# Patient Record
Sex: Female | Born: 1980 | Race: White | Hispanic: No | Marital: Married | Smoking: Never smoker
Health system: Southern US, Community
[De-identification: ages and names within clinical notes are randomized; demographics above are authoritative.]

## PROBLEM LIST (undated history)

## (undated) ENCOUNTER — Inpatient Hospital Stay (HOSPITAL_COMMUNITY): Payer: Self-pay

## (undated) DIAGNOSIS — Z789 Other specified health status: Secondary | ICD-10-CM

## (undated) DIAGNOSIS — F419 Anxiety disorder, unspecified: Secondary | ICD-10-CM

## (undated) DIAGNOSIS — F32A Depression, unspecified: Secondary | ICD-10-CM

## (undated) DIAGNOSIS — A0472 Enterocolitis due to Clostridium difficile, not specified as recurrent: Secondary | ICD-10-CM

## (undated) DIAGNOSIS — N841 Polyp of cervix uteri: Secondary | ICD-10-CM

## (undated) DIAGNOSIS — R55 Syncope and collapse: Secondary | ICD-10-CM

## (undated) HISTORY — DX: Enterocolitis due to Clostridium difficile, not specified as recurrent: A04.72

## (undated) HISTORY — DX: Syncope and collapse: R55

## (undated) HISTORY — PX: WISDOM TOOTH EXTRACTION: SHX21

## (undated) HISTORY — PX: COLONOSCOPY: SHX174

---

## 2009-12-13 DIAGNOSIS — A0472 Enterocolitis due to Clostridium difficile, not specified as recurrent: Secondary | ICD-10-CM

## 2009-12-13 HISTORY — DX: Enterocolitis due to Clostridium difficile, not specified as recurrent: A04.72

## 2015-12-14 NOTE — L&D Delivery Note (Signed)
    Laural BenesJohnson, Girl Misty StanleyStacey [[id number]][[id number]]  Delivery Note At 1:42 PM a viable female was delivered via Vaginal, Spontaneous Delivery (Presentation: OA; Vertex  ).  APGAR: 9, 9; weight 7 lb 13.2 oz (3550 g).   Placenta status: delivered intact with gentle traction.  Cord: 3 vessle with the following complications: none .    Anesthesia:  epidural Episiotomy: None Lacerations: 1st degree;Perineal Suture Repair: 3.0 vicryl Est. Blood Loss (mL): 125  Mom to postpartum.  Baby to Couplet care / Skin to Skin.  Ernestina Pennaicholas Wilna Pennie 10/18/2016, 4:27 PM

## 2016-03-12 ENCOUNTER — Ambulatory Visit (INDEPENDENT_AMBULATORY_CARE_PROVIDER_SITE_OTHER): Payer: BLUE CROSS/BLUE SHIELD | Admitting: Family

## 2016-03-12 ENCOUNTER — Encounter: Payer: Self-pay | Admitting: Family

## 2016-03-12 VITALS — BP 133/78 | HR 78 | Ht 67.0 in | Wt 158.0 lb

## 2016-03-12 DIAGNOSIS — O0990 Supervision of high risk pregnancy, unspecified, unspecified trimester: Secondary | ICD-10-CM | POA: Insufficient documentation

## 2016-03-12 DIAGNOSIS — Z6791 Unspecified blood type, Rh negative: Secondary | ICD-10-CM

## 2016-03-12 DIAGNOSIS — O26899 Other specified pregnancy related conditions, unspecified trimester: Secondary | ICD-10-CM | POA: Insufficient documentation

## 2016-03-12 DIAGNOSIS — O09529 Supervision of elderly multigravida, unspecified trimester: Secondary | ICD-10-CM | POA: Insufficient documentation

## 2016-03-12 DIAGNOSIS — Z36 Encounter for antenatal screening of mother: Secondary | ICD-10-CM

## 2016-03-12 DIAGNOSIS — Z3491 Encounter for supervision of normal pregnancy, unspecified, first trimester: Secondary | ICD-10-CM

## 2016-03-12 DIAGNOSIS — Z3480 Encounter for supervision of other normal pregnancy, unspecified trimester: Secondary | ICD-10-CM

## 2016-03-12 DIAGNOSIS — O09521 Supervision of elderly multigravida, first trimester: Secondary | ICD-10-CM

## 2016-03-12 DIAGNOSIS — Z1151 Encounter for screening for human papillomavirus (HPV): Secondary | ICD-10-CM

## 2016-03-12 DIAGNOSIS — Z3481 Encounter for supervision of other normal pregnancy, first trimester: Secondary | ICD-10-CM

## 2016-03-12 DIAGNOSIS — Z124 Encounter for screening for malignant neoplasm of cervix: Secondary | ICD-10-CM | POA: Diagnosis not present

## 2016-03-12 DIAGNOSIS — O360110 Maternal care for anti-D [Rh] antibodies, first trimester, not applicable or unspecified: Secondary | ICD-10-CM

## 2016-03-12 NOTE — Patient Instructions (Signed)
First Trimester of Pregnancy The first trimester of pregnancy is from week 1 until the end of week 12 (months 1 through 3). A week after a sperm fertilizes an egg, the egg will implant on the wall of the uterus. This embryo will begin to develop into a baby. Genes from you and your partner are forming the baby. The female genes determine whether the baby is a boy or a girl. At 6-8 weeks, the eyes and face are formed, and the heartbeat can be seen on ultrasound. At the end of 12 weeks, all the baby's organs are formed.  Now that you are pregnant, you will want to do everything you can to have a healthy baby. Two of the most important things are to get good prenatal care and to follow your health care provider's instructions. Prenatal care is all the medical care you receive before the baby's birth. This care will help prevent, find, and treat any problems during the pregnancy and childbirth. BODY CHANGES Your body goes through many changes during pregnancy. The changes vary from woman to woman.   You may gain or lose a couple of pounds at first.  You may feel sick to your stomach (nauseous) and throw up (vomit). If the vomiting is uncontrollable, call your health care provider.  You may tire easily.  You may develop headaches that can be relieved by medicines approved by your health care provider.  You may urinate more often. Painful urination may mean you have a bladder infection.  You may develop heartburn as a result of your pregnancy.  You may develop constipation because certain hormones are causing the muscles that push waste through your intestines to slow down.  You may develop hemorrhoids or swollen, bulging veins (varicose veins).  Your breasts may begin to grow larger and become tender. Your nipples may stick out more, and the tissue that surrounds them (areola) may become darker.  Your gums may bleed and may be sensitive to brushing and flossing.  Dark spots or blotches (chloasma,  mask of pregnancy) may develop on your face. This will likely fade after the baby is born.  Your menstrual periods will stop.  You may have a loss of appetite.  You may develop cravings for certain kinds of food.  You may have changes in your emotions from day to day, such as being excited to be pregnant or being concerned that something may go wrong with the pregnancy and baby.  You may have more vivid and strange dreams.  You may have changes in your hair. These can include thickening of your hair, rapid growth, and changes in texture. Some women also have hair loss during or after pregnancy, or hair that feels dry or thin. Your hair will most likely return to normal after your baby is born. WHAT TO EXPECT AT YOUR PRENATAL VISITS During a routine prenatal visit:  You will be weighed to make sure you and the baby are growing normally.  Your blood pressure will be taken.  Your abdomen will be measured to track your baby's growth.  The fetal heartbeat will be listened to starting around week 10 or 12 of your pregnancy.  Test results from any previous visits will be discussed. Your health care provider may ask you:  How you are feeling.  If you are feeling the baby move.  If you have had any abnormal symptoms, such as leaking fluid, bleeding, severe headaches, or abdominal cramping.  If you are using any tobacco products,   including cigarettes, chewing tobacco, and electronic cigarettes.  If you have any questions. Other tests that may be performed during your first trimester include:  Blood tests to find your blood type and to check for the presence of any previous infections. They will also be used to check for low iron levels (anemia) and Rh antibodies. Later in the pregnancy, blood tests for diabetes will be done along with other tests if problems develop.  Urine tests to check for infections, diabetes, or protein in the urine.  An ultrasound to confirm the proper growth  and development of the baby.  An amniocentesis to check for possible genetic problems.  Fetal screens for spina bifida and Down syndrome.  You may need other tests to make sure you and the baby are doing well.  HIV (human immunodeficiency virus) testing. Routine prenatal testing includes screening for HIV, unless you choose not to have this test. HOME CARE INSTRUCTIONS  Medicines  Follow your health care provider's instructions regarding medicine use. Specific medicines may be either safe or unsafe to take during pregnancy.  Take your prenatal vitamins as directed.  If you develop constipation, try taking a stool softener if your health care provider approves. Diet  Eat regular, well-balanced meals. Choose a variety of foods, such as meat or vegetable-based protein, fish, milk and low-fat dairy products, vegetables, fruits, and whole grain breads and cereals. Your health care provider will help you determine the amount of weight gain that is right for you.  Avoid raw meat and uncooked cheese. These carry germs that can cause birth defects in the baby.  Eating four or five small meals rather than three large meals a day may help relieve nausea and vomiting. If you start to feel nauseous, eating a few soda crackers can be helpful. Drinking liquids between meals instead of during meals also seems to help nausea and vomiting.  If you develop constipation, eat more high-fiber foods, such as fresh vegetables or fruit and whole grains. Drink enough fluids to keep your urine clear or pale yellow. Activity and Exercise  Exercise only as directed by your health care provider. Exercising will help you:  Control your weight.  Stay in shape.  Be prepared for labor and delivery.  Experiencing pain or cramping in the lower abdomen or low back is a good sign that you should stop exercising. Check with your health care provider before continuing normal exercises.  Try to avoid standing for long  periods of time. Move your legs often if you must stand in one place for a long time.  Avoid heavy lifting.  Wear low-heeled shoes, and practice good posture.  You may continue to have sex unless your health care provider directs you otherwise. Relief of Pain or Discomfort  Wear a good support bra for breast tenderness.   Take warm sitz baths to soothe any pain or discomfort caused by hemorrhoids. Use hemorrhoid cream if your health care provider approves.   Rest with your legs elevated if you have leg cramps or low back pain.  If you develop varicose veins in your legs, wear support hose. Elevate your feet for 15 minutes, 3-4 times a day. Limit salt in your diet. Prenatal Care  Schedule your prenatal visits by the twelfth week of pregnancy. They are usually scheduled monthly at first, then more often in the last 2 months before delivery.  Write down your questions. Take them to your prenatal visits.  Keep all your prenatal visits as directed by your   health care provider. Safety  Wear your seat belt at all times when driving.  Make a list of emergency phone numbers, including numbers for family, friends, the hospital, and police and fire departments. General Tips  Ask your health care provider for a referral to a local prenatal education class. Begin classes no later than at the beginning of month 6 of your pregnancy.  Ask for help if you have counseling or nutritional needs during pregnancy. Your health care provider can offer advice or refer you to specialists for help with various needs.  Do not use hot tubs, steam rooms, or saunas.  Do not douche or use tampons or scented sanitary pads.  Do not cross your legs for long periods of time.  Avoid cat litter boxes and soil used by cats. These carry germs that can cause birth defects in the baby and possibly loss of the fetus by miscarriage or stillbirth.  Avoid all smoking, herbs, alcohol, and medicines not prescribed by  your health care provider. Chemicals in these affect the formation and growth of the baby.  Do not use any tobacco products, including cigarettes, chewing tobacco, and electronic cigarettes. If you need help quitting, ask your health care provider. You may receive counseling support and other resources to help you quit.  Schedule a dentist appointment. At home, brush your teeth with a soft toothbrush and be gentle when you floss. SEEK MEDICAL CARE IF:   You have dizziness.  You have mild pelvic cramps, pelvic pressure, or nagging pain in the abdominal area.  You have persistent nausea, vomiting, or diarrhea.  You have a bad smelling vaginal discharge.  You have pain with urination.  You notice increased swelling in your face, hands, legs, or ankles. SEEK IMMEDIATE MEDICAL CARE IF:   You have a fever.  You are leaking fluid from your vagina.  You have spotting or bleeding from your vagina.  You have severe abdominal cramping or pain.  You have rapid weight gain or loss.  You vomit blood or material that looks like coffee grounds.  You are exposed to German measles and have never had them.  You are exposed to fifth disease or chickenpox.  You develop a severe headache.  You have shortness of breath.  You have any kind of trauma, such as from a fall or a car accident.   This information is not intended to replace advice given to you by your health care provider. Make sure you discuss any questions you have with your health care provider.   Document Released: 11/23/2001 Document Revised: 12/20/2014 Document Reviewed: 10/09/2013 Elsevier Interactive Patient Education 2016 [employer] Inc[employer]  

## 2016-03-12 NOTE — Progress Notes (Signed)
   Subjective:    Jordan PandaStacey K Barrack is a [id number] 3120w0d being seen today for her first obstetrical visit.  Her obstetrical history is significant for +GDM screening in past pregnancy, but normal labs throughout. Also reports history of sciatic back pain.  Currently utilizing chiropractor for 5 more visits.  Pain has improved with treatment and exercises.   Patient does intend to breast feed. Pregnancy history fully reviewed.  Patient reports nausea, no bleeding and no cramping.  Filed Vitals:   03/12/16 0908 03/12/16 0911  BP: 133/78   Pulse: 78   Height:  5\' 7"  (1.702 m)  Weight: 158 lb (71.668 kg)     HISTORY: OB History  Gravida Para Term Preterm AB SAB TAB Ectopic Multiple Living  3 2 2       2     # Outcome Date GA Lbr Len/2nd Weight Sex Delivery Anes PTL Lv  3 Current           2 Term      Vag-Spont   Y  1 Term      Vag-Spont   Y     Past Medical History  Diagnosis Date  . C. difficile enteritis 2011  . Vasovagal syncope    No past surgical history on file. Family History  Problem Relation Age of Onset  . Diabetes Father   . Hypertension Paternal Grandmother   . Skin cancer Mother   . Breast cancer Maternal Grandmother 50     Exam   BP 133/78 mmHg  Pulse 78  Ht 5\' 7"  (1.702 m)  Wt 158 lb (71.668 kg)  BMI 24.74 kg/m2  LMP 01/16/2016 Uterine Size: size equals dates  Pelvic Exam:    Perineum: No Hemorrhoids, Normal Perineum   Vulva: normal   Vagina:  normal mucosa, normal discharge, no palpable nodules   pH: Not done   Cervix: no bleeding following Pap, no cervical motion tenderness and no lesions   Adnexa: normal adnexa and no mass, fullness, tenderness   Bony Pelvis: Adequate  System: Breast:  No nipple retraction or dimpling, No nipple discharge or bleeding, No axillary or supraclavicular adenopathy, Normal to palpation without dominant masses   Skin: normal coloration and turgor, no rashes    Neurologic: negative   Extremities: normal strength, tone,  and muscle mass   HEENT neck supple with midline trachea and thyroid without masses   Mouth/Teeth mucous membranes moist, pharynx normal without lesions   Neck supple and no masses   Cardiovascular: regular rate and rhythm, no murmurs or gallops   Respiratory:  appears well, vitals normal, no respiratory distress, acyanotic, normal RR, neck free of mass or lymphadenopathy, chest clear, no wheezing, crepitations, rhonchi, normal symmetric air entry   Abdomen: soft, non-tender; bowel sounds normal; no masses,  no organomegaly   Urinary: urethral meatus normal      Assessment:    Pregnancy: [id number] Patient Active Problem List   Diagnosis Date Noted  . Supervision of normal pregnancy, antepartum 03/12/2016  . Antepartum multigravida of advanced maternal age 36/31/2017  . Rh negative, antepartum 03/12/2016        Plan:     Initial labs drawn. Pap smear collected Prenatal vitamins. Problem list reviewed and updated. Genetic Screening discussed First Screen and Integrated Screen: desires Panorama to include gender.  Follow up in 4 weeks.  Marlis EdelsonKARIM, WALIDAH N 03/12/2016

## 2016-03-12 NOTE — Progress Notes (Signed)
Bedside US shows single IUP with FHR 150 and CRL 7567w4d

## 2016-03-15 LAB — PRENATAL PROFILE (SOLSTAS)
ANTIBODY SCREEN: NEGATIVE
BASOS PCT: 0 % (ref 0–1)
Basophils Absolute: 0 10*3/uL (ref 0.0–0.1)
EOS ABS: 0.1 10*3/uL (ref 0.0–0.7)
Eosinophils Relative: 1 % (ref 0–5)
HEMATOCRIT: 39.4 % (ref 36.0–46.0)
HIV: NONREACTIVE
Hemoglobin: 13.2 g/dL (ref 12.0–15.0)
Hepatitis B Surface Ag: NEGATIVE
LYMPHS PCT: 18 % (ref 12–46)
Lymphs Abs: 1.6 10*3/uL (ref 0.7–4.0)
MCH: 30.6 pg (ref 26.0–34.0)
MCHC: 33.5 g/dL (ref 30.0–36.0)
MCV: 91.4 fL (ref 78.0–100.0)
MONO ABS: 0.6 10*3/uL (ref 0.1–1.0)
MONOS PCT: 7 % (ref 3–12)
MPV: 9.4 fL (ref 8.6–12.4)
NEUTROS ABS: 6.7 10*3/uL (ref 1.7–7.7)
Neutrophils Relative %: 74 % (ref 43–77)
Platelets: 218 10*3/uL (ref 150–400)
RBC: 4.31 MIL/uL (ref 3.87–5.11)
RDW: 12.8 % (ref 11.5–15.5)
RH TYPE: NEGATIVE
Rubella: 4.12 Index — ABNORMAL HIGH (ref <0.90)
WBC: 9.1 10*3/uL (ref 4.0–10.5)

## 2016-03-15 LAB — CYTOLOGY - PAP

## 2016-03-16 LAB — HEMOGLOBINOPATHY EVALUATION
HEMOGLOBIN OTHER: 0 %
HGB A: 97.3 % (ref 96.8–97.8)
HGB S QUANTITAION: 0 %
Hgb A2 Quant: 2.4 % (ref 2.2–3.2)
Hgb F Quant: 0.3 % (ref 0.0–2.0)

## 2016-03-16 LAB — GC/CHLAMYDIA PROBE AMP
CT PROBE, AMP APTIMA: NOT DETECTED
GC PROBE AMP APTIMA: NOT DETECTED

## 2016-03-17 LAB — CYSTIC FIBROSIS DIAGNOSTIC STUDY

## 2016-03-19 LAB — CULTURE, OB URINE: Colony Count: >=100000

## 2016-03-22 ENCOUNTER — Other Ambulatory Visit: Payer: Self-pay | Admitting: Family

## 2016-03-22 DIAGNOSIS — O2341 Unspecified infection of urinary tract in pregnancy, first trimester: Secondary | ICD-10-CM

## 2016-03-22 DIAGNOSIS — O2340 Unspecified infection of urinary tract in pregnancy, unspecified trimester: Secondary | ICD-10-CM | POA: Insufficient documentation

## 2016-03-22 MED ORDER — AMOXICILLIN 500 MG PO CAPS: 500.0000 mg | ORAL_CAPSULE | Freq: Three times a day (TID) | ORAL | Status: DC

## 2016-03-23 ENCOUNTER — Telehealth: Payer: Self-pay | Admitting: *Deleted

## 2016-03-23 NOTE — Telephone Encounter (Signed)
Pt notified of urine culture results.  Brand Jordan Larson, CNM placed the antibiotic orders but [facility]

## 2016-03-23 NOTE — Telephone Encounter (Signed)
-----   Message from Marlis EdelsonWalidah N Karim, [place] sent at 03/23/2016  8:15 AM EDT ----- Regarding: UTI - Please call Please call patient and notify her regarding UTI and need for antibiotics.  Antibiotics will need to be called in .  Order is in, it wouldn't let me electronically send.  Thank you!!  I tried to call but rolled over to voicemail.

## 2016-04-07 ENCOUNTER — Ambulatory Visit (INDEPENDENT_AMBULATORY_CARE_PROVIDER_SITE_OTHER): Payer: BLUE CROSS/BLUE SHIELD | Admitting: Obstetrics & Gynecology

## 2016-04-07 VITALS — BP 107/69 | HR 67 | Wt 157.0 lb

## 2016-04-07 DIAGNOSIS — Z36 Encounter for antenatal screening of mother: Secondary | ICD-10-CM | POA: Diagnosis not present

## 2016-04-07 DIAGNOSIS — Z3481 Encounter for supervision of other normal pregnancy, first trimester: Secondary | ICD-10-CM

## 2016-04-07 DIAGNOSIS — Z3689 Encounter for other specified antenatal screening: Secondary | ICD-10-CM

## 2016-04-07 DIAGNOSIS — O09521 Supervision of elderly multigravida, first trimester: Secondary | ICD-10-CM

## 2016-04-07 NOTE — Progress Notes (Signed)
Subjective:  Jordan Larson is a 35 y.o. G3P2002 at 2850w5d being seen today for ongoing prenatal care.  She is currently monitored for the following issues for this low-risk pregnancy and has Supervision of normal pregnancy, antepartum; Antepartum multigravida of advanced maternal age; Rh negative, antepartum; and UTI in pregnancy, antepartum on her problem list.  Patient reports no complaints.  Contractions: Not present. Vag. Bleeding: None.  Movement: Present. Denies leaking of fluid.   The following portions of the patient's history were reviewed and updated as appropriate: allergies, current medications, past family history, past medical history, past social history, past surgical history and problem list. Problem list updated.  Objective:   Filed Vitals:   04/07/16 1006  BP: 107/69  Pulse: 67  Weight: 157 lb (71.215 kg)    Fetal Status: Fetal Heart Rate (bpm): 159   Movement: Present     General:  Alert, oriented and cooperative. Patient is in no acute distress.  Skin: Skin is warm and dry. No rash noted.   Cardiovascular: Normal heart rate noted  Respiratory: Normal respiratory effort, no problems with respiration noted  Abdomen: Soft, gravid, appropriate for gestational age. Pain/Pressure: Absent     Pelvic: Vag. Bleeding: None Vag D/C Character: Thin  Cervical exam deferred        Extremities: Normal range of motion.  Edema: None  Mental Status: Normal mood and affect. Normal behavior. Normal judgment and thought content.   Urinalysis: Urine Protein: Negative Urine Glucose: Negative  Assessment and Plan:  Pregnancy: G3P2002 at 2750w5d  1. Antepartum multigravida of advanced maternal age, first trimester Panorama done today.   - Genetic Screening  2. Encounter for fetal anatomic survey Anatomy scan ordered - US MFM OB COMP + 14 WK; Future  3. Supervision of normal pregnancy, antepartum, first trimester No other complaints or concerns.  Routine obstetric precautions  reviewed. Please refer to After Visit Summary for other counseling recommendations.  Return in about 4 weeks (around 05/05/2016) for OB Visit.   Tereso NewcomerUgonna A Delesha Pohlman, MD

## 2016-04-09 ENCOUNTER — Encounter: Payer: BLUE CROSS/BLUE SHIELD | Admitting: Advanced Practice Midwife

## 2016-04-13 ENCOUNTER — Encounter: Payer: Self-pay | Admitting: *Deleted

## 2016-04-13 ENCOUNTER — Telehealth: Payer: Self-pay | Admitting: *Deleted

## 2016-04-13 DIAGNOSIS — Z3481 Encounter for supervision of other normal pregnancy, first trimester: Secondary | ICD-10-CM

## 2016-04-13 NOTE — Telephone Encounter (Signed)
Pt notified of low risk Panorama and copy of report put in a sealed envelope for pt to pick up.  Pt not told fetus sex as they are having a gender reveal party.

## 2016-05-05 ENCOUNTER — Ambulatory Visit (INDEPENDENT_AMBULATORY_CARE_PROVIDER_SITE_OTHER): Payer: BLUE CROSS/BLUE SHIELD | Admitting: Obstetrics & Gynecology

## 2016-05-05 VITALS — BP 114/75 | HR 72 | Wt 157.0 lb

## 2016-05-05 DIAGNOSIS — Z3492 Encounter for supervision of normal pregnancy, unspecified, second trimester: Secondary | ICD-10-CM

## 2016-05-05 DIAGNOSIS — Z3482 Encounter for supervision of other normal pregnancy, second trimester: Secondary | ICD-10-CM

## 2016-05-05 DIAGNOSIS — Z36 Encounter for antenatal screening of mother: Secondary | ICD-10-CM

## 2016-05-05 DIAGNOSIS — O09522 Supervision of elderly multigravida, second trimester: Secondary | ICD-10-CM

## 2016-05-05 MED ORDER — SUMATRIPTAN SUCCINATE 100 MG PO TABS: 100.0000 mg | ORAL_TABLET | ORAL | Status: DC | PRN

## 2016-05-05 NOTE — Progress Notes (Signed)
Subjective:  Jordan PandaStacey K Whedbee is a 35 y.o. G3P2002 at 2083w5d being seen today for ongoing prenatal care.  She is currently monitored for the following issues for this low-risk pregnancy and has Supervision of normal pregnancy, antepartum; Antepartum multigravida of advanced maternal age; Rh negative, antepartum; and UTI in pregnancy, antepartum on her problem list.  Patient reports headache.  Contractions: Not present. Vag. Bleeding: None.  Movement: Absent. Denies leaking of fluid.   The following portions of the patient's history were reviewed and updated as appropriate: allergies, current medications, past family history, past medical history, past social history, past surgical history and problem list. Problem list updated.  Objective:   Filed Vitals:   05/05/16 0941  BP: 114/75  Pulse: 72  Weight: 157 lb (71.215 kg)    Fetal Status:     Movement: Absent     General:  Alert, oriented and cooperative. Patient is in no acute distress.  Skin: Skin is warm and dry. No rash noted.   Cardiovascular: Normal heart rate noted  Respiratory: Normal respiratory effort, no problems with respiration noted  Abdomen: Soft, gravid, appropriate for gestational age. Pain/Pressure: Absent     Pelvic: Vag. Bleeding: None Vag D/C Character: Thin   Cervical exam deferred        Extremities: Normal range of motion.  Edema: None  Mental Status: Normal mood and affect. Normal behavior. Normal judgment and thought content.   Urinalysis:      Assessment and Plan:  Pregnancy: G3P2002 at 3783w5d  1. Prenatal care in second trimester  - CULTURE, URINE COMPREHENSIVE  2. Antepartum multigravida of advanced maternal age, second trimester - Normal NIPS, MSAFP at next visit - U/S scheduled 3. Supervision of normal pregnancy, antepartum, second trimester  4. Migraines- Imitrex as prescribed Preterm labor symptoms and general obstetric precautions including but not limited to vaginal bleeding, contractions,  leaking of fluid and fetal movement were reviewed in detail with the patient. Please refer to After Visit Summary for other counseling recommendations.  Return in about 4 weeks (around 06/02/2016) for MSAFP.   Allie BossierMyra C Aquila Delaughter, MD

## 2016-05-05 NOTE — Progress Notes (Signed)
Headaches increasing

## 2016-05-08 LAB — CULTURE, URINE COMPREHENSIVE: Colony Count: >=100000

## 2016-05-13 ENCOUNTER — Encounter (HOSPITAL_COMMUNITY): Payer: Self-pay | Admitting: Obstetrics & Gynecology

## 2016-05-21 ENCOUNTER — Ambulatory Visit (HOSPITAL_COMMUNITY)
Admission: RE | Admit: 2016-05-21 | Discharge: 2016-05-21 | Disposition: A | Discharge status: Home / Self Care | Payer: BLUE CROSS/BLUE SHIELD | Source: Ambulatory Visit | Attending: Obstetrics & Gynecology | Admitting: Obstetrics & Gynecology

## 2016-05-21 ENCOUNTER — Other Ambulatory Visit: Payer: Self-pay | Admitting: Obstetrics & Gynecology

## 2016-05-21 ENCOUNTER — Ambulatory Visit (HOSPITAL_COMMUNITY): Payer: BLUE CROSS/BLUE SHIELD

## 2016-05-21 DIAGNOSIS — Z3A18 18 weeks gestation of pregnancy: Secondary | ICD-10-CM | POA: Diagnosis not present

## 2016-05-21 DIAGNOSIS — Z3689 Encounter for other specified antenatal screening: Secondary | ICD-10-CM

## 2016-05-21 DIAGNOSIS — O26892 Other specified pregnancy related conditions, second trimester: Secondary | ICD-10-CM | POA: Insufficient documentation

## 2016-05-21 DIAGNOSIS — O09522 Supervision of elderly multigravida, second trimester: Secondary | ICD-10-CM | POA: Diagnosis present

## 2016-05-21 DIAGNOSIS — Z36 Encounter for antenatal screening of mother: Secondary | ICD-10-CM | POA: Diagnosis not present

## 2016-05-21 DIAGNOSIS — Z6791 Unspecified blood type, Rh negative: Secondary | ICD-10-CM | POA: Insufficient documentation

## 2016-05-21 DIAGNOSIS — Z3481 Encounter for supervision of other normal pregnancy, first trimester: Secondary | ICD-10-CM

## 2016-05-21 DIAGNOSIS — O360120 Maternal care for anti-D [Rh] antibodies, second trimester, not applicable or unspecified: Secondary | ICD-10-CM

## 2016-05-21 DIAGNOSIS — O09521 Supervision of elderly multigravida, first trimester: Secondary | ICD-10-CM

## 2016-06-03 ENCOUNTER — Ambulatory Visit (INDEPENDENT_AMBULATORY_CARE_PROVIDER_SITE_OTHER): Payer: BLUE CROSS/BLUE SHIELD | Admitting: Obstetrics & Gynecology

## 2016-06-03 VITALS — BP 101/59 | HR 67 | Wt 157.0 lb

## 2016-06-03 DIAGNOSIS — O09522 Supervision of elderly multigravida, second trimester: Secondary | ICD-10-CM

## 2016-06-03 DIAGNOSIS — Z3482 Encounter for supervision of other normal pregnancy, second trimester: Secondary | ICD-10-CM

## 2016-06-03 MED ORDER — BUTALBITAL-APAP-CAFFEINE 50-325-40 MG PO CAPS: 1.0000 | ORAL_CAPSULE | Freq: Four times a day (QID) | ORAL | Status: DC | PRN

## 2016-06-03 NOTE — Progress Notes (Signed)
Subjective:  Jordan Larson is a 35 y.o. G3P2002 at 738w6d being seen today for ongoing prenatal care.  She is currently monitored for the following issues for this low-risk pregnancy and has Supervision of normal pregnancy, antepartum; Antepartum multigravida of advanced maternal age; Rh negative, antepartum; and UTI in pregnancy, antepartum on her problem list.  Patient reports no complaints. She tried the imitrex but no relief. Happily, no migraine since last visit. Contractions: Not present. Vag. Bleeding: None.  Movement: Present. Denies leaking of fluid.   The following portions of the patient's history were reviewed and updated as appropriate: allergies, current medications, past family history, past medical history, past social history, past surgical history and problem list. Problem list updated.  Objective:   Filed Vitals:   06/03/16 1414  BP: 101/59  Pulse: 67  Weight: 157 lb (71.215 kg)    Fetal Status: Fetal Heart Rate (bpm): 138 Fundal Height: 20 cm Movement: Present     General:  Alert, oriented and cooperative. Patient is in no acute distress.  Skin: Skin is warm and dry. No rash noted.   Cardiovascular: Normal heart rate noted  Respiratory: Normal respiratory effort, no problems with respiration noted  Abdomen: Soft, gravid, appropriate for gestational age. Pain/Pressure: Absent     Pelvic: Cervical exam deferred        Extremities: Normal range of motion.  Edema: None  Mental Status: Normal mood and affect. Normal behavior. Normal judgment and thought content.   Urinalysis: Urine Protein: Negative Urine Glucose: Negative  Assessment and Plan:  Pregnancy: G3P2002 at 5138w6d  1. Antepartum multigravida of advanced maternal age, second trimester   2. Supervision of normal pregnancy, antepartum, second trimester  - US MFM OB FOLLOW UP; Future  Preterm labor symptoms and general obstetric precautions including but not limited to vaginal bleeding, contractions,  leaking of fluid and fetal movement were reviewed in detail with the patient. Please refer to After Visit Summary for other counseling recommendations.  Return in about 4 weeks (around 07/01/2016).   Allie BossierMyra C Emelyn Roen, MD

## 2016-07-01 ENCOUNTER — Ambulatory Visit (HOSPITAL_COMMUNITY): Payer: BLUE CROSS/BLUE SHIELD

## 2016-07-01 ENCOUNTER — Other Ambulatory Visit: Payer: Self-pay | Admitting: Obstetrics & Gynecology

## 2016-07-01 ENCOUNTER — Ambulatory Visit (HOSPITAL_COMMUNITY)
Admission: RE | Admit: 2016-07-01 | Discharge: 2016-07-01 | Disposition: A | Discharge status: Home / Self Care | Payer: BLUE CROSS/BLUE SHIELD | Source: Ambulatory Visit | Attending: Obstetrics & Gynecology | Admitting: Obstetrics & Gynecology

## 2016-07-01 DIAGNOSIS — O360120 Maternal care for anti-D [Rh] antibodies, second trimester, not applicable or unspecified: Secondary | ICD-10-CM

## 2016-07-01 DIAGNOSIS — Z0489 Encounter for examination and observation for other specified reasons: Secondary | ICD-10-CM

## 2016-07-01 DIAGNOSIS — Z3A23 23 weeks gestation of pregnancy: Secondary | ICD-10-CM

## 2016-07-01 DIAGNOSIS — Z3482 Encounter for supervision of other normal pregnancy, second trimester: Secondary | ICD-10-CM

## 2016-07-01 DIAGNOSIS — IMO0002 Reserved for concepts with insufficient information to code with codable children: Secondary | ICD-10-CM

## 2016-07-01 DIAGNOSIS — O09522 Supervision of elderly multigravida, second trimester: Secondary | ICD-10-CM | POA: Diagnosis not present

## 2016-07-07 ENCOUNTER — Encounter: Payer: Self-pay | Admitting: Obstetrics & Gynecology

## 2016-07-07 ENCOUNTER — Ambulatory Visit (INDEPENDENT_AMBULATORY_CARE_PROVIDER_SITE_OTHER): Payer: BLUE CROSS/BLUE SHIELD | Admitting: Obstetrics & Gynecology

## 2016-07-07 DIAGNOSIS — Z3482 Encounter for supervision of other normal pregnancy, second trimester: Secondary | ICD-10-CM

## 2016-07-07 NOTE — Progress Notes (Signed)
Subjective:  Jordan Larson is a 35 y.o. G3P2002 at [redacted]w[redacted]d being seen today for ongoing prenatal care.  She is currently monitored for the following issues for this high-risk pregnancy and has Supervision of normal pregnancy, antepartum; Antepartum multigravida of advanced maternal age; Rh negative, antepartum; and UTI in pregnancy, antepartum on her problem list.  Patient reports no complaints.  Contractions: Not present. Vag. Bleeding: None.  Movement: Present. Denies leaking of fluid.   The following portions of the patient's history were reviewed and updated as appropriate: allergies, current medications, past family history, past medical history, past social history, past surgical history and problem list. Problem list updated.  Objective:   Vitals:   07/07/16 0932  BP: 114/69  Pulse: 78  Weight: 160 lb (72.6 kg)    Fetal Status: Fetal Heart Rate (bpm): 151   Movement: Present     General:  Alert, oriented and cooperative. Patient is in no acute distress.  Skin: Skin is warm and dry. No rash noted.   Cardiovascular: Normal heart rate noted  Respiratory: Normal respiratory effort, no problems with respiration noted  Abdomen: Soft, gravid, appropriate for gestational age. Pain/Pressure: Present     Pelvic:  Cervical exam deferred        Extremities: Normal range of motion.  Edema: None  Mental Status: Normal mood and affect. Normal behavior. Normal judgment and thought content.   Urinalysis: Urine Protein: Trace Urine Glucose: Negative  Assessment and Plan:  Pregnancy: G3P2002 at [redacted]w[redacted]d  1. Supervision of normal pregnancy, antepartum, second trimester Nml f/u anatomy tdap at 28 weeks Pt opts for jelly bean test over glucola.  Preterm labor symptoms and general obstetric precautions including but not limited to vaginal bleeding, contractions, leaking of fluid and fetal movement were reviewed in detail with the patient. Please refer to After Visit Summary for other counseling  recommendations.  No Follow-up on file.   Lesly Dukes, MD

## 2016-08-04 ENCOUNTER — Ambulatory Visit (INDEPENDENT_AMBULATORY_CARE_PROVIDER_SITE_OTHER): Payer: BLUE CROSS/BLUE SHIELD | Admitting: Obstetrics & Gynecology

## 2016-08-04 VITALS — BP 113/71 | HR 76 | Wt 163.0 lb

## 2016-08-04 DIAGNOSIS — Z3483 Encounter for supervision of other normal pregnancy, third trimester: Secondary | ICD-10-CM

## 2016-08-04 DIAGNOSIS — O360120 Maternal care for anti-D [Rh] antibodies, second trimester, not applicable or unspecified: Secondary | ICD-10-CM | POA: Diagnosis not present

## 2016-08-04 DIAGNOSIS — O09522 Supervision of elderly multigravida, second trimester: Secondary | ICD-10-CM | POA: Diagnosis not present

## 2016-08-04 DIAGNOSIS — Z23 Encounter for immunization: Secondary | ICD-10-CM

## 2016-08-04 DIAGNOSIS — O360121 Maternal care for anti-D [Rh] antibodies, second trimester, fetus 1: Secondary | ICD-10-CM

## 2016-08-04 DIAGNOSIS — O09523 Supervision of elderly multigravida, third trimester: Secondary | ICD-10-CM

## 2016-08-04 LAB — CBC
HCT: 35.6 % (ref 35.0–45.0)
Hemoglobin: 11.7 g/dL (ref 11.7–15.5)
MCH: 30.2 pg (ref 27.0–33.0)
MCHC: 32.9 g/dL (ref 32.0–36.0)
MCV: 91.8 fL (ref 80.0–100.0)
MPV: 9.2 fL (ref 7.5–12.5)
PLATELETS: 217 10*3/uL (ref 140–400)
RBC: 3.88 MIL/uL (ref 3.80–5.10)
RDW: 13.2 % (ref 11.0–15.0)
WBC: 9.6 10*3/uL (ref 3.8–10.8)

## 2016-08-04 MED ORDER — RHO D IMMUNE GLOBULIN 1500 UNIT/2ML IJ SOSY
300.0000 ug | PREFILLED_SYRINGE | Freq: Once | INTRAMUSCULAR | Status: AC
  Administered 2016-08-04: 300 ug via INTRAMUSCULAR

## 2016-08-04 NOTE — Progress Notes (Signed)
Subjective:  Jordan Larson is a 35 y.o. G3P2002 at 576w5d being seen today for ongoing prenatal care.  She is currently monitored for the following issues for this low-risk pregnancy and has Supervision of normal pregnancy, antepartum; Antepartum multigravida of advanced maternal age; Rh negative, antepartum; and UTI in pregnancy, antepartum on her problem list.  Patient reports no complaints.  Contractions: Irritability. Vag. Bleeding: None.  Movement: Present. Denies leaking of fluid.   The following portions of the patient's history were reviewed and updated as appropriate: allergies, current medications, past family history, past medical history, past social history, past surgical history and problem list. Problem list updated.  Objective:   Vitals:   08/04/16 1016  BP: 113/71  Pulse: 76  Weight: 163 lb (73.9 kg)    Fetal Status: Fetal Heart Rate (bpm): 141   Movement: Present     General:  Alert, oriented and cooperative. Patient is in no acute distress.  Skin: Skin is warm and dry. No rash noted.   Cardiovascular: Normal heart rate noted  Respiratory: Normal respiratory effort, no problems with respiration noted  Abdomen: Soft, gravid, appropriate for gestational age. Pain/Pressure: Present     Pelvic:  Cervical exam deferred        Extremities: Normal range of motion.  Edema: None  Mental Status: Normal mood and affect. Normal behavior. Normal judgment and thought content.   Urinalysis:      Assessment and Plan:  Pregnancy: G3P2002 at 4076w5d  1. Advanced maternal age in multigravida, second trimester  - Glucose Tolerance, 1 HR (50g) - CBC - HIV antibody (with reflex) - RPR - Antibody screen; Future - Tdap vaccine greater than or equal to 7yo IM - rho (d) immune globulin (RHIG/RHOPHYLAC) injection 300 mcg; Inject 2 mLs (300 mcg total) into the muscle once. - Flu vaccine next visit  2. Rh negative state in antepartum period, second trimester, fetus 1  - Glucose  Tolerance, 1 HR (50g) - CBC - HIV antibody (with reflex) - RPR - Antibody screen; Future - Tdap vaccine greater than or equal to 7yo IM - rho (d) immune globulin (RHIG/RHOPHYLAC) injection 300 mcg; Inject 2 mLs (300 mcg total) into the muscle once.  3. Antepartum multigravida of advanced maternal age, third trimester   4. Supervision of normal pregnancy, antepartum, third trimester   Preterm labor symptoms and general obstetric precautions including but not limited to vaginal bleeding, contractions, leaking of fluid and fetal movement were reviewed in detail with the patient. Please refer to After Visit Summary for other counseling recommendations.  No Follow-up on file.   Allie BossierMyra C Jaspreet Bodner, MD

## 2016-08-04 NOTE — Addendum Note (Signed)
Addended by: Granville LewisLARK, Juanna Pudlo L on: 08/04/2016 10:41 AM   Modules accepted: Orders

## 2016-08-05 ENCOUNTER — Telehealth: Payer: Self-pay

## 2016-08-05 LAB — GLUCOSE TOLERANCE, 1 HOUR (50G) W/O FASTING: Glucose, 1 Hr, gestational: 107 mg/dL (ref <140)

## 2016-08-05 LAB — ANTIBODY SCREEN: Antibody Screen: NEGATIVE

## 2016-08-05 LAB — RPR

## 2016-08-05 LAB — HIV ANTIBODY (ROUTINE TESTING W REFLEX): HIV 1&2 Ab, 4th Generation: NONREACTIVE

## 2016-08-05 NOTE — Telephone Encounter (Signed)
Spoke with pt and she is aware of GTT and lab results

## 2016-08-26 ENCOUNTER — Ambulatory Visit (INDEPENDENT_AMBULATORY_CARE_PROVIDER_SITE_OTHER): Payer: BLUE CROSS/BLUE SHIELD | Admitting: Obstetrics and Gynecology

## 2016-08-26 VITALS — BP 108/73 | HR 73 | Wt 168.0 lb

## 2016-08-26 DIAGNOSIS — Z23 Encounter for immunization: Secondary | ICD-10-CM | POA: Diagnosis not present

## 2016-08-26 DIAGNOSIS — O09523 Supervision of elderly multigravida, third trimester: Secondary | ICD-10-CM

## 2016-08-26 DIAGNOSIS — O360130 Maternal care for anti-D [Rh] antibodies, third trimester, not applicable or unspecified: Secondary | ICD-10-CM

## 2016-08-26 DIAGNOSIS — Z3483 Encounter for supervision of other normal pregnancy, third trimester: Secondary | ICD-10-CM

## 2016-08-26 NOTE — Addendum Note (Signed)
Addended by: Mariel AloeLARK, Isaac Dubie L on: 08/26/2016 03:00 PM   Modules accepted: Orders

## 2016-08-26 NOTE — Progress Notes (Signed)
   PRENATAL VISIT NOTE  Subjective:  Jordan Larson is a 35 y.o. G3P2002 at 3832w6d being seen today for ongoing prenatal care.  She is currently monitored for the following issues for this low-risk pregnancy and has Supervision of normal pregnancy, antepartum; Antepartum multigravida of advanced maternal age; Rh negative, antepartum; and UTI in pregnancy, antepartum on her problem list.  Patient reports no complaints.   .  .  Movement: Present. Denies leaking of fluid.   The following portions of the patient's history were reviewed and updated as appropriate: allergies, current medications, past family history, past medical history, past social history, past surgical history and problem list. Problem list updated.  Objective:   Vitals:   08/26/16 1431  BP: 108/73  Pulse: 73  Weight: 168 lb (76.2 kg)    Fetal Status: Fetal Heart Rate (bpm): 138 Fundal Height: 31 cm Movement: Present     General:  Alert, oriented and cooperative. Patient is in no acute distress.  Skin: Skin is warm and dry. No rash noted.   Cardiovascular: Normal heart rate noted  Respiratory: Normal respiratory effort, no problems with respiration noted  Abdomen: Soft, gravid, appropriate for gestational age. Pain/Pressure: Present     Pelvic:  Cervical exam deferred        Extremities: Normal range of motion.  Edema: None  Mental Status: Normal mood and affect. Normal behavior. Normal judgment and thought content.   Urinalysis: Urine Protein: Trace Urine Glucose: Negative  Assessment and Plan:  Pregnancy: G3P2002 at 8232w6d  1. Supervision of normal pregnancy, antepartum, third trimester Patient is doing well without complaints FLu shot today Reviewed third trimester lab results  2. Rh negative, antepartum, third trimester, not applicable or unspecified fetus   3. Antepartum multigravida of advanced maternal age, third trimester   Preterm labor symptoms and general obstetric precautions including but not  limited to vaginal bleeding, contractions, leaking of fluid and fetal movement were reviewed in detail with the patient. Please refer to After Visit Summary for other counseling recommendations.  Return in about 2 weeks (around 09/09/2016).  Catalina AntiguaPeggy Virlan Kempker, MD

## 2016-09-10 ENCOUNTER — Ambulatory Visit (INDEPENDENT_AMBULATORY_CARE_PROVIDER_SITE_OTHER): Payer: BLUE CROSS/BLUE SHIELD | Admitting: Family

## 2016-09-10 VITALS — BP 105/70 | HR 69 | Wt 168.0 lb

## 2016-09-10 DIAGNOSIS — Z3483 Encounter for supervision of other normal pregnancy, third trimester: Secondary | ICD-10-CM

## 2016-09-10 DIAGNOSIS — O09523 Supervision of elderly multigravida, third trimester: Secondary | ICD-10-CM

## 2016-09-10 NOTE — Progress Notes (Signed)
   PRENATAL VISIT NOTE  Subjective:  Jordan Larson is a 35 y.o. G3P2002 at 3771w0d being seen today for ongoing prenatal care.  She is currently monitored for the following issues for this low-risk pregnancy and has Supervision of normal pregnancy, antepartum; Antepartum multigravida of advanced maternal age; Rh negative, antepartum; and UTI in pregnancy, antepartum on her problem list.  Patient reports no complaints.  Contractions: Irritability. Vag. Bleeding: None.  Movement: Present. Denies leaking of fluid.   The following portions of the patient's history were reviewed and updated as appropriate: allergies, current medications, past family history, past medical history, past social history, past surgical history and problem list. Problem list updated.  Objective:   Vitals:   09/10/16 0915  BP: 105/70  Pulse: 69  Weight: 168 lb (76.2 kg)    Fetal Status: Fetal Heart Rate (bpm): 137 Fundal Height: 34 cm Movement: Present     General:  Alert, oriented and cooperative. Patient is in no acute distress.  Skin: Skin is warm and dry. No rash noted.   Cardiovascular: Normal heart rate noted  Respiratory: Normal respiratory effort, no problems with respiration noted  Abdomen: Soft, gravid, appropriate for gestational age. Pain/Pressure: Present     Pelvic:  Cervical exam deferred        Extremities: Normal range of motion.  Edema: Trace  Mental Status: Normal mood and affect. Normal behavior. Normal judgment and thought content.   Urinalysis: Urine Protein: Negative Urine Glucose: Negative  Assessment and Plan:  Pregnancy: G3P2002 at 9771w0d  1. Supervision of normal pregnancy, antepartum, third trimester - Discussed GBS screening for next visit  2. Antepartum multigravida of advanced maternal age, third trimester - NIPS wnl  Preterm labor symptoms and general obstetric precautions including but not limited to vaginal bleeding, contractions, leaking of fluid and fetal movement were  reviewed in detail with the patient. Please refer to After Visit Summary for other counseling recommendations.  Return in about 2 weeks (around 09/24/2016).  Eino FarberWalidah Kennith GainN Karim, CNM

## 2016-09-17 ENCOUNTER — Inpatient Hospital Stay (HOSPITAL_COMMUNITY)
Admission: AD | Admit: 2016-09-17 | Discharge: 2016-09-17 | Disposition: A | Discharge status: Home / Self Care | Payer: BLUE CROSS/BLUE SHIELD | Source: Ambulatory Visit | Attending: Obstetrics and Gynecology | Admitting: Obstetrics and Gynecology

## 2016-09-17 ENCOUNTER — Telehealth: Payer: Self-pay

## 2016-09-17 ENCOUNTER — Encounter (HOSPITAL_COMMUNITY): Payer: Self-pay | Admitting: *Deleted

## 2016-09-17 DIAGNOSIS — Z3A35 35 weeks gestation of pregnancy: Secondary | ICD-10-CM | POA: Diagnosis not present

## 2016-09-17 DIAGNOSIS — Z3493 Encounter for supervision of normal pregnancy, unspecified, third trimester: Secondary | ICD-10-CM

## 2016-09-17 DIAGNOSIS — R51 Headache: Secondary | ICD-10-CM | POA: Insufficient documentation

## 2016-09-17 DIAGNOSIS — R519 Headache, unspecified: Secondary | ICD-10-CM

## 2016-09-17 DIAGNOSIS — O26893 Other specified pregnancy related conditions, third trimester: Secondary | ICD-10-CM | POA: Insufficient documentation

## 2016-09-17 DIAGNOSIS — O09529 Supervision of elderly multigravida, unspecified trimester: Secondary | ICD-10-CM

## 2016-09-17 DIAGNOSIS — O2340 Unspecified infection of urinary tract in pregnancy, unspecified trimester: Secondary | ICD-10-CM

## 2016-09-17 DIAGNOSIS — Z3689 Encounter for other specified antenatal screening: Secondary | ICD-10-CM

## 2016-09-17 DIAGNOSIS — O26899 Other specified pregnancy related conditions, unspecified trimester: Secondary | ICD-10-CM

## 2016-09-17 DIAGNOSIS — Z3490 Encounter for supervision of normal pregnancy, unspecified, unspecified trimester: Secondary | ICD-10-CM

## 2016-09-17 DIAGNOSIS — Z6791 Unspecified blood type, Rh negative: Secondary | ICD-10-CM

## 2016-09-17 LAB — URINALYSIS, ROUTINE W REFLEX MICROSCOPIC
BILIRUBIN URINE: NEGATIVE
GLUCOSE, UA: 250 mg/dL — AB
HGB URINE DIPSTICK: NEGATIVE
Ketones, ur: 15 mg/dL — AB
Leukocytes, UA: NEGATIVE
Nitrite: NEGATIVE
PH: 6.5 (ref 5.0–8.0)
Protein, ur: NEGATIVE mg/dL

## 2016-09-17 MED ORDER — METOCLOPRAMIDE HCL 10 MG PO TABS
10.0000 mg | ORAL_TABLET | Freq: Once | ORAL | Status: AC
  Administered 2016-09-17: 10 mg via ORAL
  Filled 2016-09-17: qty 1

## 2016-09-17 MED ORDER — ACETAMINOPHEN 500 MG PO TABS
1000.0000 mg | ORAL_TABLET | Freq: Four times a day (QID) | ORAL | Status: DC | PRN
  Administered 2016-09-17: 1000 mg via ORAL
  Filled 2016-09-17: qty 2

## 2016-09-17 NOTE — MAU Note (Signed)
C/o progressively stronger abdominal pain this week; c/o headache for past 2 days; c/o generally feeling  "bad" for past 2 days;

## 2016-09-17 NOTE — Discharge Instructions (Signed)

## 2016-09-17 NOTE — Telephone Encounter (Signed)
Patient called complaining of headache for last 24 hrs and tylenol not helping. Patient asked about staying hydrated and states she keeps a" Yeti of water with me all day".  Patient states baby moving well and denies any bleeding or leaking of fluid. Patient states that she hasnt had headaches with her other two pregnancies and she overall doesn't feel well. Patient has notice more "braxton Hicks contractions".   Patient instructed at this point no appointment available in office but she should go to Maternity Admissions unit for evaluation. Patient states understanding and agreeable with plan. Armandina StammerJennifer Bianna Haran RNBSN

## 2016-09-17 NOTE — MAU Provider Note (Signed)
History     CSN: [id number]  Arrival date and time: 09/17/16 1025   None     Chief Complaint  Patient presents with  . Headache  . Fatigue  . Abdominal Pain   G3P2002 @35 .0 weeks c/o occipital HA x2 days. She took Tylenol and had good relief then HA returned after Tylenol wore off. No aura or nausea. She is hydrating well with water. She denies visual disturbances and epigastric pain. She reports good FM. She reports some BH ctx and cramping but denies regular ctx. No VB or LOF. She has hx of migraine HA when not pregnant and uses Imitrex.    OB History    Gravida Para Term Preterm AB Living   3 2 2     2    SAB TAB Ectopic Multiple Live Births           2      Past Medical History:  Diagnosis Date  . C. difficile enteritis 2011   Sensitive to antibiotics; does well on Amoxicillin  . Vasovagal syncope     Past Surgical History:  Procedure Laterality Date  . COLONOSCOPY    . WISDOM TOOTH EXTRACTION      Family History  Problem Relation Age of Onset  . Diabetes Father   . Hypertension Paternal Grandmother   . Skin cancer Mother   . Breast cancer Maternal Grandmother [id number]    Social History  Substance Use Topics  . Smoking status: Never Smoker  . Smokeless tobacco: Never Used  . Alcohol use No    Allergies: No Known Allergies  Prescriptions Prior to Admission  Medication Sig Dispense Refill Last Dose  . acidophilus (RISAQUAD) CAPS capsule Take 1 capsule by mouth at bedtime.   09/16/2016 at Unknown time  . cetirizine (ZYRTEC) 10 MG tablet Take 10 mg by mouth at bedtime.    09/16/2016 at Unknown time  . Prenatal MV-Min-FA-Omega-3 (PRENATAL GUMMIES/DHA & FA) 0.4-32.5 MG CHEW Chew 2 each by mouth at bedtime.   09/16/2016 at Unknown time    Review of Systems  Constitutional: Negative.   Genitourinary: Negative.   Neurological: Positive for headaches.   Physical Exam   Blood pressure 119/66, pulse 86, temperature 98.1 F (36.7 C), temperature source Oral, resp.  rate 16, last menstrual period 01/16/2016.  Physical Exam  Constitutional: She is oriented to person, place, and time. She appears well-developed and well-nourished.  HENT:  Head: Normocephalic and atraumatic.  Neck: Normal range of motion.  Cardiovascular: Normal rate.   Respiratory: Effort normal.  GI: Soft. She exhibits no distension. There is no tenderness.  gravid  Musculoskeletal: Normal range of motion.  Neurological: She is alert and oriented to person, place, and time.  Skin: Skin is warm and dry.  Psychiatric: She has a normal mood and affect.  EFM: 140 bpm, mod variability, + accels, no decels Toco: rare ctx Results for orders placed or performed during the hospital encounter of 09/17/16 (from the past 24 hour(s))  Urinalysis, Routine w reflex microscopic (not [facility])     Status: Abnormal   Collection Time: 09/17/16 10:30 AM  Result Value Ref Range   Color, Urine YELLOW YELLOW   APPearance CLEAR CLEAR   Specific Gravity, Urine <1.005 (L) 1.005 - 1.030   pH 6.5 5.0 - 8.0   Glucose, UA 250 (A) NEGATIVE mg/dL   Hgb urine dipstick NEGATIVE NEGATIVE   Bilirubin Urine NEGATIVE NEGATIVE   Ketones, ur 15 (A) NEGATIVE mg/dL   Protein, ur  NEGATIVE NEGATIVE mg/dL   Nitrite NEGATIVE NEGATIVE   Leukocytes, UA NEGATIVE NEGATIVE    MAU Course  Procedures Tylenol 1g po x1 Reglan 10 mg po x1  MDM Labs ordered and reviewed. No evidence of dehydration or pre-e. HA resolved after meds. Stable for discharge home.   Assessment and Plan  [redacted] weeks gestation Headache in pregnancy Reactive NST  Discharge home Fioricet 1-2 q6 hrs prn (has Rx) Follow [facility] at [facility] in 1 week Return for worsening sx   Medication List    TAKE these medications   acidophilus Caps capsule Take 1 capsule by mouth at bedtime.   cetirizine 10 MG tablet Commonly known as:  ZYRTEC Take 10 mg by mouth at bedtime.   PRENATAL GUMMIES/DHA & FA 0.4-32.5 MG Chew Chew 2 each by mouth at bedtime.        Donette Larry, CNM 09/17/2016, 11:05 AM

## 2016-09-24 ENCOUNTER — Ambulatory Visit (INDEPENDENT_AMBULATORY_CARE_PROVIDER_SITE_OTHER): Payer: BLUE CROSS/BLUE SHIELD | Admitting: Family

## 2016-09-24 VITALS — BP 107/68 | HR 79 | Wt 174.0 lb

## 2016-09-24 DIAGNOSIS — Z3480 Encounter for supervision of other normal pregnancy, unspecified trimester: Secondary | ICD-10-CM

## 2016-09-24 DIAGNOSIS — O360930 Maternal care for other rhesus isoimmunization, third trimester, not applicable or unspecified: Secondary | ICD-10-CM

## 2016-09-24 DIAGNOSIS — Z113 Encounter for screening for infections with a predominantly sexual mode of transmission: Secondary | ICD-10-CM | POA: Diagnosis not present

## 2016-09-24 DIAGNOSIS — O26893 Other specified pregnancy related conditions, third trimester: Secondary | ICD-10-CM

## 2016-09-24 DIAGNOSIS — R519 Headache, unspecified: Secondary | ICD-10-CM

## 2016-09-24 DIAGNOSIS — Z6791 Unspecified blood type, Rh negative: Secondary | ICD-10-CM

## 2016-09-24 DIAGNOSIS — O26899 Other specified pregnancy related conditions, unspecified trimester: Secondary | ICD-10-CM

## 2016-09-24 DIAGNOSIS — R51 Headache: Secondary | ICD-10-CM

## 2016-09-24 LAB — OB RESULTS CONSOLE GBS: GBS: NEGATIVE

## 2016-09-24 MED ORDER — CYCLOBENZAPRINE HCL 10 MG PO TABS: 10.0000 mg | ORAL_TABLET | Freq: Every day | ORAL | 1 refills | Status: DC

## 2016-09-24 NOTE — Progress Notes (Signed)
   PRENATAL VISIT NOTE  Subjective:  Jordan Larson is a 35 y.o. G3P2002 at 981w0d being seen today for ongoing prenatal care.  She is currently monitored for the following issues for this low-risk pregnancy and has Supervision of normal pregnancy, antepartum; Antepartum multigravida of advanced maternal age; Rh negative, antepartum; and UTI in pregnancy, antepartum on her problem list.  Patient reports intermittent headache x 1 week.  Denies vision changes or epigastric pain.  Contractions: Irritability. Vag. Bleeding: None.  Movement: Present. Denies leaking of fluid.   The following portions of the patient's history were reviewed and updated as appropriate: allergies, current medications, past family history, past medical history, past social history, past surgical history and problem list. Problem list updated.  Objective:   Vitals:   09/24/16 1043  BP: 107/68  Pulse: 79  Weight: 174 lb (78.9 kg)    Fetal Status: Fetal Heart Rate (bpm): 128 Fundal Height: 36 cm Movement: Present  Presentation: Vertex  General:  Alert, oriented and cooperative. Patient is in no acute distress.  Skin: Skin is warm and dry. No rash noted.   Cardiovascular: Normal heart rate noted  Respiratory: Normal respiratory effort, no problems with respiration noted  Abdomen: Soft, gravid, appropriate for gestational age. Pain/Pressure: Present     Pelvic:  Cervical exam performed Dilation: 1 Effacement (%): Thick    Extremities: Normal range of motion.  Edema: Trace  Mental Status: Normal mood and affect. Normal behavior. Normal judgment and thought content.   Urinalysis: Urine Protein: Negative Urine Glucose: Negative  Assessment and Plan:  Pregnancy: G3P2002 at 251w0d  1. Supervision of other normal pregnancy, antepartum - Discussed labor signs  2. Rh negative, antepartum - Received Rhophylac  3. Headache in pregnancy, antepartum - cyclobenzaprine (FLEXERIL) 10 MG tablet; Take 1 tablet (10 mg total)  by mouth at bedtime.  Dispense: 15 tablet; Refill: 1  Preterm labor symptoms and general obstetric precautions including but not limited to vaginal bleeding, contractions, leaking of fluid and fetal movement were reviewed in detail with the patient. Please refer to After Visit Summary for other counseling recommendations.  Return in about 1 week (around 10/01/2016).  Jordan Larson, CNM

## 2016-09-24 NOTE — Addendum Note (Signed)
Addended by: Granville LewisLARK, Kathrin Folden L on: 09/24/2016 11:17 AM   Modules accepted: Orders

## 2016-09-26 LAB — CULTURE, BETA STREP (GROUP B ONLY)

## 2016-09-27 LAB — URINE CYTOLOGY ANCILLARY ONLY
Chlamydia: NEGATIVE
Neisseria Gonorrhea: NEGATIVE

## 2016-09-29 ENCOUNTER — Ambulatory Visit (INDEPENDENT_AMBULATORY_CARE_PROVIDER_SITE_OTHER): Payer: BLUE CROSS/BLUE SHIELD | Admitting: Obstetrics & Gynecology

## 2016-09-29 DIAGNOSIS — Z3483 Encounter for supervision of other normal pregnancy, third trimester: Secondary | ICD-10-CM

## 2016-09-29 DIAGNOSIS — Z3480 Encounter for supervision of other normal pregnancy, unspecified trimester: Secondary | ICD-10-CM

## 2016-09-29 NOTE — Progress Notes (Signed)
   PRENATAL VISIT NOTE  Subjective:  Jordan PandaStacey K Millette is a 35 y.o. G3P2002 at 6834w5d being seen today for ongoing prenatal care.  She is currently monitored for the following issues for this high-risk pregnancy and has Supervision of normal pregnancy, antepartum; Antepartum multigravida of advanced maternal age; Rh negative, antepartum; and UTI in pregnancy, antepartum on her problem list.  Patient reports no complaints.  Contractions: Irregular. Vag. Bleeding: None.  Movement: Present. Denies leaking of fluid.   The following portions of the patient's history were reviewed and updated as appropriate: allergies, current medications, past family history, past medical history, past social history, past surgical history and problem list. Problem list updated.  Objective:   Vitals:   09/29/16 1602  BP: 116/80  Pulse: 64  Weight: 176 lb (79.8 kg)    Fetal Status: Fetal Heart Rate (bpm): 128   Movement: Present     General:  Alert, oriented and cooperative. Patient is in no acute distress.  Skin: Skin is warm and dry. No rash noted.   Cardiovascular: Normal heart rate noted  Respiratory: Normal respiratory effort, no problems with respiration noted  Abdomen: Soft, gravid, appropriate for gestational age. Pain/Pressure: Present     Pelvic:  Cervical exam performed      2/50/-2  Extremities: Normal range of motion.  Edema: Trace  Mental Status: Normal mood and affect. Normal behavior. Normal judgment and thought content.   Assessment and Plan:  Pregnancy: G3P2002 at 2034w5d  There are no diagnoses linked to this encounter. Term labor symptoms and general obstetric precautions including but not limited to vaginal bleeding, contractions, leaking of fluid and fetal movement were reviewed in detail with the patient. Please refer to After Visit Summary for other counseling recommendations.   RTC 1 week.  Lesly DukesKelly H Sindia Kowalczyk, MD

## 2016-10-06 ENCOUNTER — Ambulatory Visit (INDEPENDENT_AMBULATORY_CARE_PROVIDER_SITE_OTHER): Payer: BLUE CROSS/BLUE SHIELD | Admitting: Obstetrics & Gynecology

## 2016-10-06 VITALS — BP 113/67 | HR 91 | Wt 177.0 lb

## 2016-10-06 DIAGNOSIS — O09529 Supervision of elderly multigravida, unspecified trimester: Secondary | ICD-10-CM

## 2016-10-06 DIAGNOSIS — O09523 Supervision of elderly multigravida, third trimester: Secondary | ICD-10-CM

## 2016-10-06 DIAGNOSIS — Z3480 Encounter for supervision of other normal pregnancy, unspecified trimester: Secondary | ICD-10-CM

## 2016-10-06 NOTE — Progress Notes (Signed)
   PRENATAL VISIT NOTE  Subjective:  Jordan Larson is a 35 y.o.MW  G3P2002 at 1236w5d being seen today for ongoing prenatal care.  She is currently monitored for the following issues for this low-risk pregnancy and has Supervision of normal pregnancy, antepartum; Antepartum multigravida of advanced maternal age; Rh negative, antepartum; and UTI in pregnancy, antepartum on her problem list.  Patient reports no complaints.  Contractions: Irregular. Vag. Bleeding: None.  Movement: Present. Denies leaking of fluid.   The following portions of the patient's history were reviewed and updated as appropriate: allergies, current medications, past family history, past medical history, past social history, past surgical history and problem list. Problem list updated.  Objective:   Vitals:   10/06/16 1033  BP: 113/67  Pulse: 91  Weight: 177 lb (80.3 kg)    Fetal Status: Fetal Heart Rate (bpm): 131   Movement: Present     General:  Alert, oriented and cooperative. Patient is in no acute distress.  Skin: Skin is warm and dry. No rash noted.   Cardiovascular: Normal heart rate noted  Respiratory: Normal respiratory effort, no problems with respiration noted  Abdomen: Soft, gravid, appropriate for gestational age. Pain/Pressure: Present     Pelvic:  Cervical exam performed        Extremities: Normal range of motion.  Edema: Trace  Mental Status: Normal mood and affect. Normal behavior. Normal judgment and thought content.   Assessment and Plan:  Pregnancy: G3P2002 at 2136w5d  1. Supervision of other normal pregnancy, antepartum - Due to her h/o rapid labors and distance from the hospital (30 minutes), we have discussed a possible IOL at 39 weeks.   2. Antepartum multigravida of advanced maternal age  Term labor symptoms and general obstetric precautions including but not limited to vaginal bleeding, contractions, leaking of fluid and fetal movement were reviewed in detail with the  patient. Please refer to After Visit Summary for other counseling recommendations.  No Follow-up on file.  Allie BossierMyra C Justine Cossin, MD

## 2016-10-08 ENCOUNTER — Inpatient Hospital Stay (HOSPITAL_COMMUNITY)
Admission: AD | Admit: 2016-10-08 | Discharge: 2016-10-08 | Disposition: A | Discharge status: Home / Self Care | Payer: BLUE CROSS/BLUE SHIELD | Source: Ambulatory Visit | Attending: Obstetrics and Gynecology | Admitting: Obstetrics and Gynecology

## 2016-10-08 ENCOUNTER — Encounter (HOSPITAL_COMMUNITY): Payer: Self-pay | Admitting: *Deleted

## 2016-10-08 DIAGNOSIS — Z6791 Unspecified blood type, Rh negative: Secondary | ICD-10-CM

## 2016-10-08 DIAGNOSIS — O26899 Other specified pregnancy related conditions, unspecified trimester: Secondary | ICD-10-CM

## 2016-10-08 DIAGNOSIS — Z3480 Encounter for supervision of other normal pregnancy, unspecified trimester: Secondary | ICD-10-CM

## 2016-10-08 DIAGNOSIS — O2340 Unspecified infection of urinary tract in pregnancy, unspecified trimester: Secondary | ICD-10-CM

## 2016-10-08 DIAGNOSIS — O09529 Supervision of elderly multigravida, unspecified trimester: Secondary | ICD-10-CM

## 2016-10-08 HISTORY — DX: Other specified health status: Z78.9

## 2016-10-08 NOTE — MAU Note (Addendum)
Been a lot of contractions today,  Cramping in low back.  Was 4+cm on Wed. Has had bleeding after exam, membranes stripped.

## 2016-10-08 NOTE — Discharge Instructions (Signed)
Keep scheduled appointment

## 2016-10-13 ENCOUNTER — Encounter (HOSPITAL_COMMUNITY): Payer: Self-pay | Admitting: *Deleted

## 2016-10-13 ENCOUNTER — Telehealth (HOSPITAL_COMMUNITY): Payer: Self-pay | Admitting: *Deleted

## 2016-10-13 ENCOUNTER — Ambulatory Visit (INDEPENDENT_AMBULATORY_CARE_PROVIDER_SITE_OTHER): Payer: BLUE CROSS/BLUE SHIELD | Admitting: Obstetrics & Gynecology

## 2016-10-13 VITALS — BP 110/71 | HR 83 | Wt 178.0 lb

## 2016-10-13 DIAGNOSIS — O09529 Supervision of elderly multigravida, unspecified trimester: Secondary | ICD-10-CM

## 2016-10-13 DIAGNOSIS — O26899 Other specified pregnancy related conditions, unspecified trimester: Secondary | ICD-10-CM

## 2016-10-13 DIAGNOSIS — Z6791 Unspecified blood type, Rh negative: Secondary | ICD-10-CM

## 2016-10-13 DIAGNOSIS — O09523 Supervision of elderly multigravida, third trimester: Secondary | ICD-10-CM

## 2016-10-13 DIAGNOSIS — Z3480 Encounter for supervision of other normal pregnancy, unspecified trimester: Secondary | ICD-10-CM

## 2016-10-13 NOTE — Progress Notes (Signed)
   PRENATAL VISIT NOTE  Subjective:  Jordan Larson is a 35 y.o. G3P2002 at 7750w5d being seen today for ongoing prenatal care.  She is currently monitored for the following issues for this high-risk pregnancy and has Supervision of normal pregnancy, antepartum; Antepartum multigravida of advanced maternal age; Rh negative, antepartum; and UTI in pregnancy, antepartum on her problem list.  Patient reports no complaints.  Contractions: Irregular. Vag. Bleeding: None.  Movement: Present. Denies leaking of fluid.   The following portions of the patient's history were reviewed and updated as appropriate: allergies, current medications, past family history, past medical history, past social history, past surgical history and problem list. Problem list updated.  Objective:   Vitals:   10/13/16 1049  BP: 110/71  Pulse: 83  Weight: 178 lb (80.7 kg)    Fetal Status: Fetal Heart Rate (bpm): 124 Fundal Height: 37 cm Movement: Present     General:  Alert, oriented and cooperative. Patient is in no acute distress.  Skin: Skin is warm and dry. No rash noted.   Cardiovascular: Normal heart rate noted  Respiratory: Normal respiratory effort, no problems with respiration noted  Abdomen: Soft, gravid, appropriate for gestational age. Pain/Pressure: Present     Pelvic:  Cervical exam performed        Extremities: Normal range of motion.  Edema: Trace  Mental Status: Normal mood and affect. Normal behavior. Normal judgment and thought content.   Assessment and Plan:  Pregnancy: G3P2002 at 450w5d  There are no diagnoses linked to this encounter. Term labor symptoms and general obstetric precautions including but not limited to vaginal bleeding, contractions, leaking of fluid and fetal movement were reviewed in detail with the patient.  Pt would like to induced for history of rapid deliveries.  First available spot is Monday, 10/18/16.   Please refer to After Visit Summary for other counseling  recommendations.  No Follow-up on file.  Jordan DukesKelly H Janett Kamath, MD

## 2016-10-13 NOTE — Telephone Encounter (Signed)
Preadmission screen  

## 2016-10-18 ENCOUNTER — Inpatient Hospital Stay (HOSPITAL_COMMUNITY): Payer: BLUE CROSS/BLUE SHIELD | Admitting: Anesthesiology

## 2016-10-18 ENCOUNTER — Inpatient Hospital Stay (HOSPITAL_COMMUNITY)
Admission: RE | Admit: 2016-10-18 | Discharge: 2016-10-20 | DRG: 775 | Disposition: A | Discharge status: Home / Self Care | Payer: BLUE CROSS/BLUE SHIELD | Source: Ambulatory Visit | Attending: Family Medicine | Admitting: Family Medicine

## 2016-10-18 ENCOUNTER — Encounter (HOSPITAL_COMMUNITY): Payer: Self-pay

## 2016-10-18 ENCOUNTER — Encounter (HOSPITAL_COMMUNITY): Payer: Self-pay | Admitting: Anesthesiology

## 2016-10-18 DIAGNOSIS — O09529 Supervision of elderly multigravida, unspecified trimester: Secondary | ICD-10-CM

## 2016-10-18 DIAGNOSIS — Z6791 Unspecified blood type, Rh negative: Secondary | ICD-10-CM

## 2016-10-18 DIAGNOSIS — O26899 Other specified pregnancy related conditions, unspecified trimester: Secondary | ICD-10-CM

## 2016-10-18 DIAGNOSIS — Z8249 Family history of ischemic heart disease and other diseases of the circulatory system: Secondary | ICD-10-CM

## 2016-10-18 DIAGNOSIS — Z833 Family history of diabetes mellitus: Secondary | ICD-10-CM

## 2016-10-18 DIAGNOSIS — O2340 Unspecified infection of urinary tract in pregnancy, unspecified trimester: Secondary | ICD-10-CM

## 2016-10-18 DIAGNOSIS — Z3A39 39 weeks gestation of pregnancy: Secondary | ICD-10-CM | POA: Diagnosis not present

## 2016-10-18 DIAGNOSIS — O26893 Other specified pregnancy related conditions, third trimester: Secondary | ICD-10-CM | POA: Diagnosis present

## 2016-10-18 DIAGNOSIS — Z3480 Encounter for supervision of other normal pregnancy, unspecified trimester: Secondary | ICD-10-CM

## 2016-10-18 LAB — CBC
HCT: 35.7 % — ABNORMAL LOW (ref 36.0–46.0)
Hemoglobin: 11.8 g/dL — ABNORMAL LOW (ref 12.0–15.0)
MCH: 27.3 pg (ref 26.0–34.0)
MCHC: 33.1 g/dL (ref 30.0–36.0)
MCV: 82.6 fL (ref 78.0–100.0)
PLATELETS: 200 10*3/uL (ref 150–400)
RBC: 4.32 MIL/uL (ref 3.87–5.11)
RDW: 15.6 % — AB (ref 11.5–15.5)
WBC: 9.6 10*3/uL (ref 4.0–10.5)

## 2016-10-18 LAB — RPR: RPR Ser Ql: NONREACTIVE

## 2016-10-18 MED ORDER — DIPHENHYDRAMINE HCL 50 MG/ML IJ SOLN: 12.5000 mg | INTRAMUSCULAR | Status: DC | PRN

## 2016-10-18 MED ORDER — TETANUS-DIPHTH-ACELL PERTUSSIS 5-2.5-18.5 LF-MCG/0.5 IM SUSP: 0.5000 mL | Freq: Once | INTRAMUSCULAR | Status: DC

## 2016-10-18 MED ORDER — PHENYLEPHRINE 40 MCG/ML (10ML) SYRINGE FOR IV PUSH (FOR BLOOD PRESSURE SUPPORT)
80.0000 ug | PREFILLED_SYRINGE | INTRAVENOUS | Status: AC | PRN
  Administered 2016-10-18 (×3): 80 ug via INTRAVENOUS
  Filled 2016-10-18: qty 10

## 2016-10-18 MED ORDER — ONDANSETRON HCL 4 MG/2ML IJ SOLN: 4.0000 mg | INTRAMUSCULAR | Status: DC | PRN

## 2016-10-18 MED ORDER — ONDANSETRON HCL 4 MG/2ML IJ SOLN: 4.0000 mg | Freq: Four times a day (QID) | INTRAMUSCULAR | Status: DC | PRN

## 2016-10-18 MED ORDER — EPHEDRINE 5 MG/ML INJ
10.0000 mg | INTRAVENOUS | Status: DC | PRN
  Filled 2016-10-18: qty 4

## 2016-10-18 MED ORDER — PRENATAL MULTIVITAMIN CH
1.0000 | ORAL_TABLET | Freq: Every day | ORAL | Status: DC
  Filled 2016-10-18 (×2): qty 1

## 2016-10-18 MED ORDER — SOD CITRATE-CITRIC ACID 500-334 MG/5ML PO SOLN: 30.0000 mL | ORAL | Status: DC | PRN

## 2016-10-18 MED ORDER — ZOLPIDEM TARTRATE 5 MG PO TABS
5.0000 mg | ORAL_TABLET | Freq: Every evening | ORAL | Status: DC | PRN
Start: 2016-10-18 — End: 2016-10-20

## 2016-10-18 MED ORDER — SIMETHICONE 80 MG PO CHEW: 80.0000 mg | CHEWABLE_TABLET | ORAL | Status: DC | PRN

## 2016-10-18 MED ORDER — DIBUCAINE 1 % RE OINT
1.0000 "application " | TOPICAL_OINTMENT | RECTAL | Status: DC | PRN
  Administered 2016-10-20: 1 via RECTAL
  Filled 2016-10-18: qty 28

## 2016-10-18 MED ORDER — PHENYLEPHRINE 40 MCG/ML (10ML) SYRINGE FOR IV PUSH (FOR BLOOD PRESSURE SUPPORT)
80.0000 ug | PREFILLED_SYRINGE | INTRAVENOUS | Status: DC | PRN
  Filled 2016-10-18: qty 10
  Filled 2016-10-18: qty 5
  Filled 2016-10-18: qty 10

## 2016-10-18 MED ORDER — TERBUTALINE SULFATE 1 MG/ML IJ SOLN
0.2500 mg | Freq: Once | INTRAMUSCULAR | Status: DC | PRN
  Filled 2016-10-18: qty 1

## 2016-10-18 MED ORDER — LIDOCAINE HCL (PF) 1 % IJ SOLN
INTRAMUSCULAR | Status: DC | PRN
  Administered 2016-10-18 (×2): 4 mL via EPIDURAL

## 2016-10-18 MED ORDER — LACTATED RINGERS IV SOLN: 500.0000 mL | INTRAVENOUS | Status: DC | PRN

## 2016-10-18 MED ORDER — LACTATED RINGERS IV SOLN: 500.0000 mL | Freq: Once | INTRAVENOUS | Status: DC

## 2016-10-18 MED ORDER — BENZOCAINE-MENTHOL 20-0.5 % EX AERO
1.0000 "application " | INHALATION_SPRAY | CUTANEOUS | Status: DC | PRN
  Administered 2016-10-18: 1 via TOPICAL
  Filled 2016-10-18: qty 56

## 2016-10-18 MED ORDER — FENTANYL 2.5 MCG/ML BUPIVACAINE 1/10 % EPIDURAL INFUSION (WH - ANES)
14.0000 mL/h | INTRAMUSCULAR | Status: DC | PRN
  Administered 2016-10-18: 14 mL/h via EPIDURAL
  Filled 2016-10-18: qty 100

## 2016-10-18 MED ORDER — OXYCODONE-ACETAMINOPHEN 5-325 MG PO TABS: 1.0000 | ORAL_TABLET | ORAL | Status: DC | PRN

## 2016-10-18 MED ORDER — OXYCODONE-ACETAMINOPHEN 5-325 MG PO TABS: 2.0000 | ORAL_TABLET | ORAL | Status: DC | PRN

## 2016-10-18 MED ORDER — OXYTOCIN BOLUS FROM INFUSION: 500.0000 mL | Freq: Once | INTRAVENOUS | Status: DC

## 2016-10-18 MED ORDER — COCONUT OIL OIL: 1.0000 "application " | TOPICAL_OIL | Status: DC | PRN

## 2016-10-18 MED ORDER — LIDOCAINE HCL (PF) 1 % IJ SOLN
30.0000 mL | INTRAMUSCULAR | Status: DC | PRN
  Filled 2016-10-18: qty 30

## 2016-10-18 MED ORDER — ACETAMINOPHEN 325 MG PO TABS: 650.0000 mg | ORAL_TABLET | ORAL | Status: DC | PRN

## 2016-10-18 MED ORDER — OXYTOCIN 40 UNITS IN LACTATED RINGERS INFUSION - SIMPLE MED: 2.5000 [IU]/h | INTRAVENOUS | Status: DC

## 2016-10-18 MED ORDER — DIPHENHYDRAMINE HCL 25 MG PO CAPS: 25.0000 mg | ORAL_CAPSULE | Freq: Four times a day (QID) | ORAL | Status: DC | PRN

## 2016-10-18 MED ORDER — SENNOSIDES-DOCUSATE SODIUM 8.6-50 MG PO TABS
2.0000 | ORAL_TABLET | ORAL | Status: DC
  Administered 2016-10-19 – 2016-10-20 (×2): 2 via ORAL
  Filled 2016-10-18 (×2): qty 2

## 2016-10-18 MED ORDER — ONDANSETRON HCL 4 MG PO TABS: 4.0000 mg | ORAL_TABLET | ORAL | Status: DC | PRN

## 2016-10-18 MED ORDER — EPHEDRINE 5 MG/ML INJ
10.0000 mg | INTRAVENOUS | Status: DC | PRN
  Filled 2016-10-18 (×2): qty 4

## 2016-10-18 MED ORDER — WITCH HAZEL-GLYCERIN EX PADS
1.0000 "application " | MEDICATED_PAD | CUTANEOUS | Status: DC | PRN
  Administered 2016-10-20: 1 via TOPICAL

## 2016-10-18 MED ORDER — OXYTOCIN 40 UNITS IN LACTATED RINGERS INFUSION - SIMPLE MED
1.0000 m[IU]/min | INTRAVENOUS | Status: DC
  Administered 2016-10-18: 2 m[IU]/min via INTRAVENOUS
  Filled 2016-10-18: qty 1000

## 2016-10-18 MED ORDER — LACTATED RINGERS IV SOLN
INTRAVENOUS | Status: DC
  Administered 2016-10-18 (×2): via INTRAVENOUS

## 2016-10-18 MED ORDER — IBUPROFEN 600 MG PO TABS
600.0000 mg | ORAL_TABLET | Freq: Four times a day (QID) | ORAL | Status: DC
  Administered 2016-10-18 – 2016-10-20 (×8): 600 mg via ORAL
  Filled 2016-10-18 (×8): qty 1

## 2016-10-18 NOTE — Anesthesia Procedure Notes (Addendum)
Epidural  Start time: 10/18/2016 9:28 AM End time: 10/18/2016 9:43 AM  Staffing Anesthesiologist: Mal AmabileFOSTER, Mandalyn Pasqua Performed: anesthesiologist   Preanesthetic Checklist Completed: patient identified, site marked, surgical consent, pre-op evaluation, timeout performed, IV checked, risks and benefits discussed and monitors and equipment checked  Epidural Patient position: sitting Prep: DuraPrep Patient monitoring: continuous pulse ox and blood pressure Approach: midline Location: L3-L4 Injection technique: LOR air  Needle:  Needle type: Tuohy  Needle gauge: 17 G Needle length: 9 cm Needle insertion depth: 4 cm Catheter type: closed end flexible Catheter size: 19 Gauge Catheter at skin depth: 9 cm Test dose: Other  Additional Notes Patient identified. Risks and benefits discussed including failed block, incomplete  Pain control, post dural puncture headache, nerve damage, paralysis, blood pressure Changes, nausea, vomiting, reactions to medications-both toxic and allergic and post Partum back pain. All questions were answered. Patient expressed understanding and wished to proceed. Sterile technique was used throughout procedure. Epidural site was Dressed with sterile barrier dressing. No paresthesias, signs of intravascular injection Or signs of intrathecal spread were encountered.  Patient was more comfortable after the epidural was dosed. Please see RN's note for documentation of vital signs and FHR which are stable.

## 2016-10-18 NOTE — Anesthesia Preprocedure Evaluation (Addendum)
Anesthesia Evaluation  Patient identified by MRN, date of birth, ID band Patient awake    Reviewed: Allergy & Precautions, H&P , Patient's Chart, lab work & pertinent test results  Airway Mallampati: II  TM Distance: >3 FB Neck ROM: full    Dental no notable dental hx. (+) Teeth Intact   Pulmonary neg pulmonary ROS,    Pulmonary exam normal breath sounds clear to auscultation       Cardiovascular negative cardio ROS Normal cardiovascular exam Rhythm:regular Rate:Normal     Neuro/Psych negative neurological ROS  negative psych ROS   GI/Hepatic negative GI ROS, Neg liver ROS,   Endo/Other  negative endocrine ROS  Renal/GU negative Renal ROS  negative genitourinary   Musculoskeletal   Abdominal   Peds  Hematology  (+) anemia ,   Anesthesia Other Findings   Reproductive/Obstetrics (+) Pregnancy AMA                             Lab Results  Component Value Date   WBC 9.6 10/18/2016   HGB 11.8 (L) 10/18/2016   HCT 35.7 (L) 10/18/2016   MCV 82.6 10/18/2016   PLT 200 10/18/2016    Anesthesia Physical Anesthesia Plan  ASA: II  Anesthesia Plan: Epidural   Post-op Pain Management:    Induction:   Airway Management Planned:   Additional Equipment:   Intra-op Plan:   Post-operative Plan:   Informed Consent: I have reviewed the patients History and Physical, chart, labs and discussed the procedure including the risks, benefits and alternatives for the proposed anesthesia with the patient or authorized representative who has indicated his/her understanding and acceptance.     Plan Discussed with: Anesthesiologist  Anesthesia Plan Comments:         Anesthesia Quick Evaluation

## 2016-10-18 NOTE — Progress Notes (Signed)
Pt had vagal response after IV start fainted in the bed easliy revived with ammonia inhalent

## 2016-10-18 NOTE — Anesthesia Postprocedure Evaluation (Signed)
Anesthesia Post Note  Patient: Jordan Larson  Procedure(s) Performed: * No procedures listed *  Patient location during evaluation: Mother Baby Anesthesia Type: Epidural Level of consciousness: awake and alert Pain management: pain level controlled Vital Signs Assessment: post-procedure vital signs reviewed and stable Respiratory status: spontaneous breathing Cardiovascular status: stable and blood pressure returned to baseline Postop Assessment: no headache, no backache, epidural receding, no signs of nausea or vomiting and adequate PO intake Anesthetic complications: no     Last Vitals:  Vitals:   10/18/16 1606 10/18/16 1721  BP: 116/72 112/65  Pulse: 74 70  Resp: 18 18  Temp: 36.9 C 36.7 C    Last Pain:  Vitals:   10/18/16 1721  TempSrc: Oral   Pain Goal:                 [name]

## 2016-10-18 NOTE — H&P (Signed)
LABOR AND DELIVERY ADMISSION HISTORY AND PHYSICAL NOTE  Jordan Larson is a 35 y.o. female G3P2002 with IUP at 6814w3d by LMP and 1st trimester US presenting for IOL due to history of rapid labor.   Doing well, just had epidural placed and is more comfortable. She reports positive fetal movement. She denies leakage of fluid or vaginal bleeding.  Prenatal History/Complications:  Past Medical History: Past Medical History:  Diagnosis Date  . C. difficile enteritis 2011   Sensitive to antibiotics; does well on Amoxicillin  . Medical history non-contributory   . Vasovagal syncope     Past Surgical History: Past Surgical History:  Procedure Laterality Date  . COLONOSCOPY    . WISDOM TOOTH EXTRACTION      Obstetrical History: OB History    Gravida Para Term Preterm AB Living   3 2 2     2    SAB TAB Ectopic Multiple Live Births           2      Social History: Social History   Social History  . Marital status: Married    Spouse name: N/A  . Number of children: N/A  . Years of education: N/A   Social History Main Topics  . Smoking status: Never Smoker  . Smokeless tobacco: Never Used  . Alcohol use No  . Drug use: No  . Sexual activity: Yes    Partners: Male   Other Topics Concern  . None   Social History Narrative  . None    Family History: Family History  Problem Relation Age of Onset  . Diabetes Father   . Hypertension Paternal Grandmother   . COPD Paternal Grandmother   . Skin cancer Mother   . Breast cancer Maternal Grandmother 50  . Vision loss Maternal Grandmother     glaucoma  . Cancer Maternal Grandfather     colon    Allergies: No Known Allergies  Prescriptions Prior to Admission  Medication Sig Dispense Refill Last Dose  . acidophilus (RISAQUAD) CAPS capsule Take 1 capsule by mouth at bedtime.   Taking  . cetirizine (ZYRTEC) 10 MG tablet Take 10 mg by mouth at bedtime.    Taking  . cyclobenzaprine (FLEXERIL) 10 MG tablet Take 1  tablet (10 mg total) by mouth at bedtime. (Patient not taking: Reported on 10/13/2016) 15 tablet 1 Not Taking  . Prenatal MV-Min-FA-Omega-3 (PRENATAL GUMMIES/DHA & FA) 0.4-32.5 MG CHEW Chew 2 each by mouth at bedtime.   Taking     Review of Systems   All systems reviewed and negative except as stated in HPI  Blood pressure 105/67, pulse 75, temperature 98.7 F (37.1 C), temperature source Oral, height 5\' 7"  (1.702 m), weight 78.5 kg (173 lb), last menstrual period 01/16/2016. General appearance: alert, cooperative and no distress Lungs: no respiratory distress Heart: regular rate Abdomen: soft, non-tender Extremities: No calf swelling or tenderness Presentation: cephalic by nurse exam Fetal monitoring: baseline 135, moderate variability, accelerations present, no decelerations Uterine activity: irritable Dilation: 4 Effacement (%): 60 Station: -2 Exam by:: J.Cox, RN   Prenatal labs: ABO, Rh: A/NEG/-- (03/31 0931) Antibody: NEG (08/23 1041) Rubella: immune RPR: NON REAC (08/23 1041)  HBsAg: NEGATIVE (03/31 0931)  HIV: NONREACTIVE (08/23 1041)  GBS: Negative (10/13 0000)   Prenatal Transfer Tool  Maternal Diabetes: No Genetic Screening: Normal Maternal Ultrasounds/Referrals: Normal Fetal Ultrasounds or other Referrals:  Referred to Materal Fetal Medicine  Maternal Substance Abuse:  No Significant Maternal Medications:  None Significant  Maternal Lab Results: None  Results for orders placed or performed during the hospital encounter of 10/18/16 (from the past 24 hour(s))  CBC   Collection Time: 10/18/16  8:10 AM  Result Value Ref Range   WBC 9.6 4.0 - 10.5 K/uL   RBC 4.32 3.87 - 5.11 MIL/uL   Hemoglobin 11.8 (L) 12.0 - 15.0 g/dL   HCT 54.635.7 (L) 27.036.0 - 35.046.0 %   MCV 82.6 78.0 - 100.0 fL   MCH 27.3 26.0 - 34.0 pg   MCHC 33.1 30.0 - 36.0 g/dL   RDW 09.315.6 (H) 81.811.5 - 29.915.5 %   Platelets 200 150 - 400 K/uL    Patient Active Problem List   Diagnosis Date Noted  . Labor and  delivery, indication for care 10/18/2016  . UTI in pregnancy, antepartum 03/22/2016  . Supervision of normal pregnancy, antepartum 03/12/2016  . Antepartum multigravida of advanced maternal age 84/31/2017  . Rh negative, antepartum 03/12/2016    Assessment: Jordan PandaStacey K Larson is a 35 y.o. G3P2002 at 7458w3d here for IOL for rapid labor  #Labor: IOL #Pain: epidural #FWB: Category 1 tracing #ID:  GBS negative #MOF: breast #MOC: condoms #Circ:  n/a  Tillman SersAngela C Riccio, DO PGY-1 11/6/20179:29 AM   OB FELLOW HISTORY AND PHYSICAL ATTESTATION  I have seen and examined this patient; I agree with above documentation in the resident's note.    Ernestina Pennaicholas Schenk 10/18/2016, 11:31 AM

## 2016-10-19 MED ORDER — RHO D IMMUNE GLOBULIN 1500 UNIT/2ML IJ SOSY
300.0000 ug | PREFILLED_SYRINGE | Freq: Once | INTRAMUSCULAR | Status: AC
  Administered 2016-10-19: 300 ug via INTRAVENOUS
  Filled 2016-10-19: qty 2

## 2016-10-19 NOTE — Progress Notes (Signed)
Patient ID: Jordan Larson, female   DOB: Mar 03, 1981, 35 y.o.   MRN: [id number]  Post Partum Day 1  Subjective:  Jordan Larson is a 35 y.o. G3P3003 1225w3d s/p IOL and NSVD.  No acute events overnight.  Pt denies problems with ambulating, voiding or po intake.  She denies nausea or vomiting.  Pain is well controlled.  She has had flatus. She has not had bowel movement.  Lochia Small.  Plan for birth control is condoms.  Method of Feeding: breast   Objective: BP 114/70 (BP Location: Right Arm)   Pulse 66   Temp 97.8 F (36.6 C) (Oral)   Resp 18   Ht 5\' 7"  (1.702 m)   Wt 78.5 kg (173 lb)   LMP 01/16/2016   SpO2 100%   Breastfeeding? Unknown   BMI 27.10 kg/m   Physical Exam:  General: alert, cooperative and no distress Lochia:normal flow Chest: CTAB Heart: RRR no m/r/g Abdomen: +BS, soft, nontender, fundus firm at/below umbilicus Uterine Fundus: firm, below level of umbilicus DVT Evaluation: No evidence of DVT seen on physical exam. Extremities: no edema   Recent Labs  10/18/16 0810  HGB 11.8*  HCT 35.7*    Assessment/Plan:  ASSESSMENT: Jordan Larson is a 35 y.o. G3P3003 5225w3d ppd #1 s/p NSVD doing well.   Plan for discharge tomorrow   LOS: 1 day   Tillman Sersngela C Riccio 10/19/2016, 7:54 AM    OB FELLOW POSTPARTUM PROGRESS NOTE ATTESTATION  I have seen and examined this patient and agree with above documentation in the resident's note.   Ernestina PennaNicholas Katriana Dortch, MD 8:55 AM

## 2016-10-19 NOTE — Lactation Note (Signed)
This note was copied from a baby's chart. Lactation Consultation Note Experienced BF mom had to pump and dump fer her first baby for 2-3 months d/t C-diff, then BF for 6 months and stopped. Her 2nd baby she BF for 10 months but had enough milk for 13 months. No difficulty. This baby mom is staying at home and plans on BF as long as she can. Mom states baby has been sleepy and not hungry. While in rm. Baby had large emesis of mucous throfhy sputum.  Mom encouraged to feed baby 8-12 times/24 hours and with feeding cues. Mom encouraged to waken baby for feeds. Educated on STS, I&O, cluster feeding. [facility] broch[facility] given w/resources, support groups and LC services.  Patient Name: Jordan Larson ZOXWR'UToday's Date: 10/19/2016 Reason for consult: Initial assessment   Maternal Data Has patient been taught Hand Expression?: Yes Does the patient have breastfeeding experience prior to this delivery?: Yes  Feeding Feeding Type: Breast Fed Length of feed: 30 min  LATCH Score/Interventions Latch: Too sleepy or reluctant, no latch achieved, no sucking elicited. Intervention(s): Teach feeding cues;Waking techniques     Type of Nipple: Everted at rest and after stimulation  Comfort (Breast/Nipple): Soft / non-tender           Lactation Tools Discussed/Used     Consult Status Consult Status: Follow-up Date: 10/20/16 Follow-up type: In-patient    Charyl DancerCARVER, Vue Pavon G 10/19/2016, 6:48 AM

## 2016-10-20 LAB — TYPE AND SCREEN
ABO/RH(D): A NEG
ANTIBODY SCREEN: POSITIVE
DAT, IGG: NEGATIVE
UNIT DIVISION: 0
Unit division: 0

## 2016-10-20 LAB — RH IG WORKUP (INCLUDES ABO/RH)
ABO/RH(D): A NEG
Fetal Screen: NEGATIVE
Gestational Age(Wks): 39.3
UNIT DIVISION: 0

## 2016-10-20 MED ORDER — IBUPROFEN 600 MG PO TABS: 600.0000 mg | ORAL_TABLET | Freq: Four times a day (QID) | ORAL | 0 refills | Status: DC

## 2016-10-20 MED ORDER — ACETAMINOPHEN 325 MG PO TABS: 650.0000 mg | ORAL_TABLET | ORAL | 0 refills | Status: DC | PRN

## 2016-10-20 MED ORDER — SENNOSIDES-DOCUSATE SODIUM 8.6-50 MG PO TABS: 2.0000 | ORAL_TABLET | ORAL | 1 refills | Status: DC

## 2016-10-20 NOTE — Discharge Instructions (Signed)

## 2016-10-20 NOTE — Anesthesia Postprocedure Evaluation (Signed)
Anesthesia Post Note  Patient: Jordan Larson  Procedure(s) Performed: * No procedures listed *  Patient location during evaluation: Mother Baby Anesthesia Type: Epidural Level of consciousness: awake and alert Pain management: pain level controlled Vital Signs Assessment: post-procedure vital signs reviewed and stable Respiratory status: spontaneous breathing, nonlabored ventilation and respiratory function stable Cardiovascular status: stable Postop Assessment: no headache, no backache and epidural receding Anesthetic complications: no    Last Vitals:  Vitals:   10/19/16 1755 10/20/16 0521  BP: 113/68 109/64  Pulse: 70 61  Resp: 18 19  Temp: 36.9 C 36.7 C    Last Pain:  Vitals:   10/20/16 0738  TempSrc:   PainSc: 3                  Kennieth RadFitzgerald, Tikita Mabee E

## 2016-10-20 NOTE — Lactation Note (Signed)
This note was copied from a baby's chart. Lactation Consultation Note  Patient Name: Jordan Larson ZOXWR'UToday's Date: 10/20/2016  Follow up discharge visit. Mom states baby cluster last night.  Baby recently fed and in FOB's arms sleeping.  Discharge teaching done including engorgement treatment.  Questions answered.  Lactation outpatient services and support information reviewed and encouraged.   Maternal Data    Feeding    LATCH Score/Interventions                      Lactation Tools Discussed/Used     Consult Status      Huston FoleyMOULDEN, Shavawn Stobaugh S 10/20/2016, 11:09 AM

## 2016-10-20 NOTE — Discharge Summary (Signed)
OB Discharge Summary     Patient Name: Jordan PandaStacey K Larson DOB: 1981/05/14 MRN: [id number]  Date of admission: 10/18/2016 Delivering MD:    Erich MontaneJohnson, PendingBaby [[id number]][[id number]]      Laural BenesJohnson, Girl Misty StanleyStacey [[id number]][[id number]]  Ernestina PennaSCHENK, NICHOLAS MICHAEL   Date of discharge: 10/20/2016  Admitting diagnosis: INDUCTION Intrauterine pregnancy: 8824w3d     Secondary diagnosis:  Active Problems:   Labor and delivery, indication for care  Additional problems: None     Discharge diagnosis: Term Pregnancy Delivered                                                                                                Post partum procedures:None  Augmentation: Pitocin  Complications: None  Hospital course:  Induction of Labor With Vaginal Delivery     35 y.o. yo G3P3003 at 1424w3d was admitted for IOL on 10/18/2016. Patient had an uncomplicated labor course as follows:  Membrane Rupture Time/Date:    Erich MontaneJohnson, PendingBaby [[id number]][[id number]]      Laural BenesJohnson, Girl [place] [[id number]][[id number]]  11:54 AM ,   Erich MontaneJohnson, PendingBaby [[id number]][[id number]]      Laural BenesJohnson, Girl Misty StanleyStacey [[id number]][[id number]]  10/18/2016   Intrapartum Procedures: Episiotomy:    Erich MontaneJohnson, PendingBaby [[id number]][[id number]]      Laural BenesJohnson, Girl [place] [[id number]][[id number]]  None [1]                                         Lacerations:     Erich MontaneJohnson, PendingBaby [[id number]][[id number]]      Laural BenesJohnson, Girl Misty StanleyStacey [[id number]][[id number]]  1st degree [2];Perineal [11]  Patient had a delivery of a Viable infant.   Erich MontaneJohnson, PendingBaby [[id number]][[id number]]    Jones BroomJohnson, Girl Thekla [[id number]][[id number]]  10/18/2016 Information for the patient's newborn:  Erich MontaneJohnson, PendingBaby [[id number]][[id number]]    Information for the patient's newborn:  Jones BroomJohnson, Girl [place] [[id number]][[id number]]  Delivery Method: Vaginal, Spontaneous Delivery (Filed from Delivery Summary)    Pateint had an uncomplicated postpartum course.  She is ambulating, tolerating a regular diet, passing flatus, and urinating well. Patient is discharged home in stable condition on  10/20/16.    Physical exam  Vitals:   10/18/16 2120 10/19/16 0545 10/19/16 1755 10/20/16 0521  BP: 133/72 114/70 113/68 109/64  Pulse: 71 66 70 61  Resp: 18 18 18 19   Temp: 98.4 F (36.9 C) 97.8 F (36.6 C) 98.4 F (36.9 C) 98.1 F (36.7 C)  TempSrc: Oral Oral Oral   SpO2:      Weight:      Height:       General: alert, cooperative and no distress Lochia: appropriate Uterine Fundus: firm Incision: N/A DVT Evaluation: No evidence of DVT seen on physical exam. Labs: Lab Results  Component Value Date   WBC 9.6 10/18/2016   HGB 11.8 (L) 10/18/2016   HCT 35.7 (L) 10/18/2016   MCV 82.6 10/18/2016   PLT 200 10/18/2016   No flowsheet data found.  Discharge instruction: per After Visit Summary and "Baby and Me Booklet".  After visit meds:    Medication List  STOP taking these medications   guaiFENesin 600 MG 12 hr tablet Commonly known as:  MUCINEX   sodium chloride 0.65 % Soln nasal spray Commonly known as:  OCEAN     TAKE these medications   acetaminophen 325 MG tablet Commonly known as:  TYLENOL Take 2 tablets (650 mg total) by mouth every 4 (four) hours as needed (for pain scale < 4).   bifidobacterium infantis capsule Take 1 capsule by mouth at bedtime.   calcium carbonate 500 MG chewable tablet Commonly known as:  TUMS - dosed in mg elemental calcium Chew 2 tablets by mouth daily as needed for indigestion or heartburn.   cetirizine 10 MG tablet Commonly known as:  ZYRTEC Take 10 mg by mouth at bedtime.   ibuprofen 600 MG tablet Commonly known as:  ADVIL,MOTRIN Take 1 tablet (600 mg total) by mouth every 6 (six) hours.   prenatal multivitamin Tabs tablet Take 1 tablet by mouth at bedtime.   senna-docusate 8.6-50 MG tablet Commonly known as:  Senokot-S Take 2 tablets by mouth daily. Start taking on:  10/21/2016       Diet: routine diet  Activity: Advance as tolerated. Pelvic rest for 6 weeks.   Outpatient follow up:6 weeks Follow up  Appt:No future appointments. Follow up Visit:No Follow-up on file.  Postpartum contraception: Condoms  Newborn Data:   Erich MontaneJohnson, PendingBaby [[id number]][[id number]]  Live born unspecified sex  Birth Weight:   APGAR: ,    Laural BenesJohnson, Girl Misty StanleyStacey [[id number]][[id number]]  Live born female  Birth Weight: 7 lb 13.2 oz (3550 g) APGAR: 9, 9  Baby Feeding: Breast Disposition:home with mother   10/20/2016 Deniece ReeJosue D Nainika Newlun, MD

## 2016-11-02 ENCOUNTER — Encounter: Payer: Self-pay | Admitting: *Deleted

## 2016-11-18 ENCOUNTER — Ambulatory Visit (INDEPENDENT_AMBULATORY_CARE_PROVIDER_SITE_OTHER): Payer: BLUE CROSS/BLUE SHIELD | Admitting: Obstetrics & Gynecology

## 2016-11-18 ENCOUNTER — Encounter: Payer: Self-pay | Admitting: Obstetrics & Gynecology

## 2016-11-18 NOTE — Progress Notes (Signed)
Post Partum Exam  Jordan PandaStacey K Manes is a 35 y.o. MW [phone] female who presents for a postpartum visit. She is 4 weeks postpartum following a vaginal delivery. I have fully reviewed the prenatal and intrapartum course. The delivery was at 39 gestational weeks.  Anesthesia: epidural. Postpartum course has been unremarkable. Baby's course has been unremarkable. Baby is feeding by breast.. Bleeding: scant. Bowel function is normal. Bladder function is normal. Patient is not sexually active. Contraception method is condoms. Postpartum depression screening:neg  The following portions of the patient's history were reviewed and updated as appropriate: allergies, current medications, past family history, past medical history, past social history, past surgical history and problem list.  Review of Systems Pertinent items are noted in HPI.    Objective:    BP 116/78 mmHg  Pulse 78  Resp 16  Ht 5\' 5"  (1.651 m)  Wt 211 lb (95.709 kg)  BMI 35.11 kg/m2  Breastfeeding? Yes  General:  alert   Breasts:  inspection negative, no nipple discharge or bleeding, no masses or nodularity palpable  Lungs: clear to auscultation bilaterally  Heart:  regular rate and rhythm, S1, S2 normal, no murmur, click, rub or gallop  Abdomen: soft, non-tender; bowel sounds normal; no masses,  no organomegaly   Vulva:  normal  Vagina: normal vagina  Cervix:  anteverted  Corpus: normal  Adnexa:  no mass, fullness, tenderness  Rectal Exam: Not performed.        Assessment:    Normal postpartum exam. Pap smear not done at today's visit.   Plan:   1. Contraception: condoms

## 2017-08-22 DIAGNOSIS — F411 Generalized anxiety disorder: Secondary | ICD-10-CM | POA: Insufficient documentation

## 2018-01-12 DIAGNOSIS — E559 Vitamin D deficiency, unspecified: Secondary | ICD-10-CM | POA: Insufficient documentation

## 2018-05-16 IMAGING — US US MFM OB DETAIL+14 WK
1 series · 13 of 28 positions shown · non-contrast
Comparison: none

[Series 1: us mfm ob detail+14 wk · 134 acquisitions, 13 frames shown]
[im 5/134]
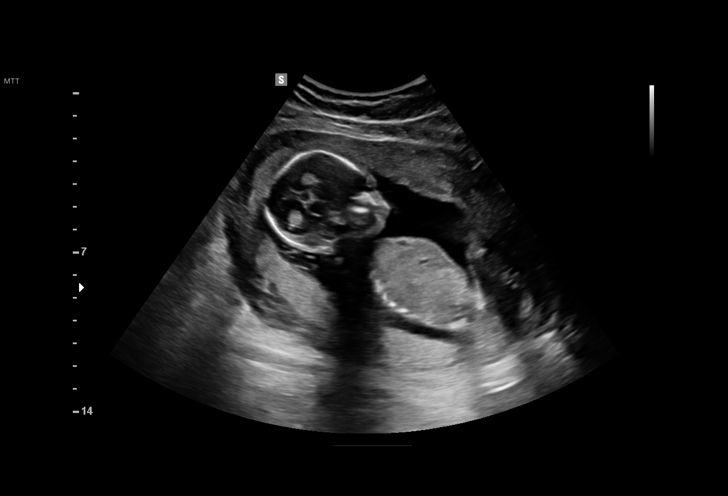
[im 15/134]
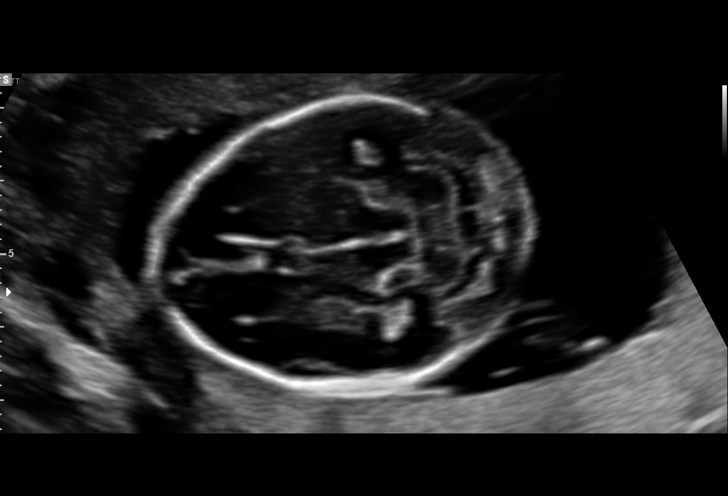
[im 25/134]
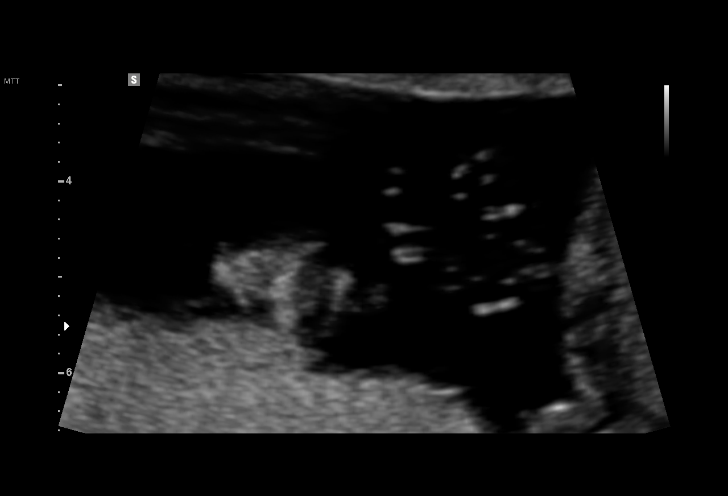
[im 35/134]
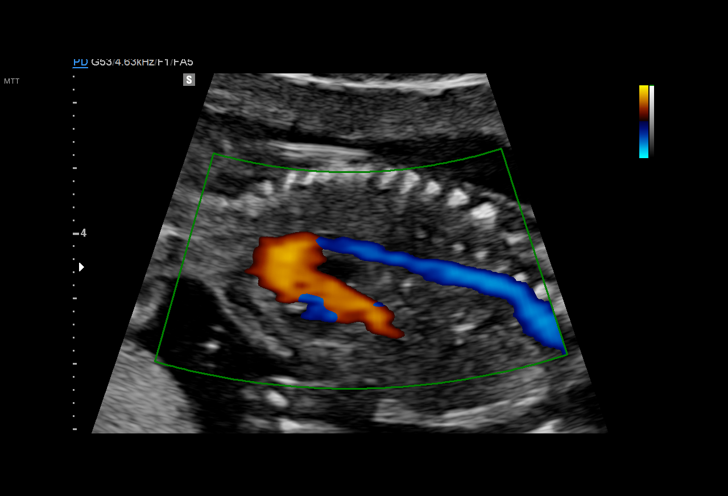
[im 45/134]
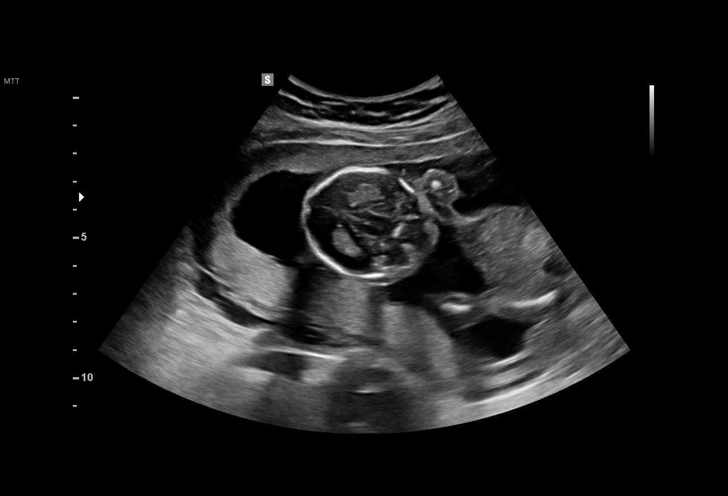
[im 55/134]
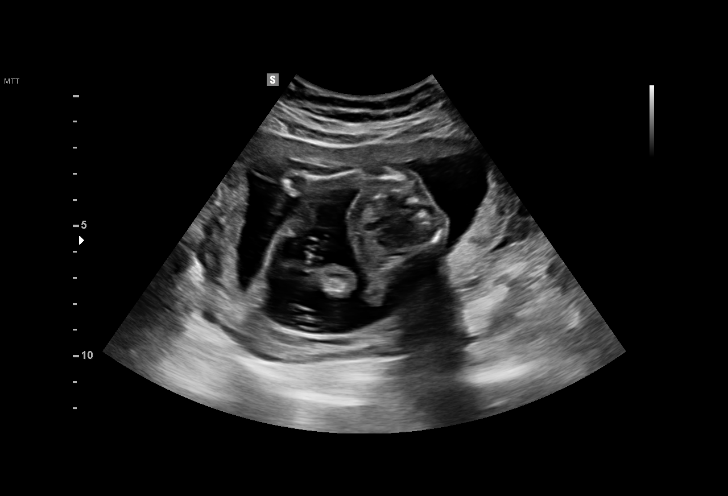
[im 69/134]
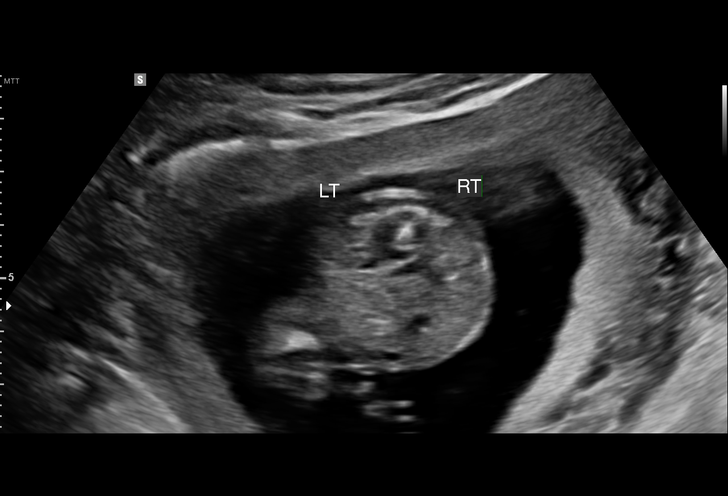
[im 79/134]
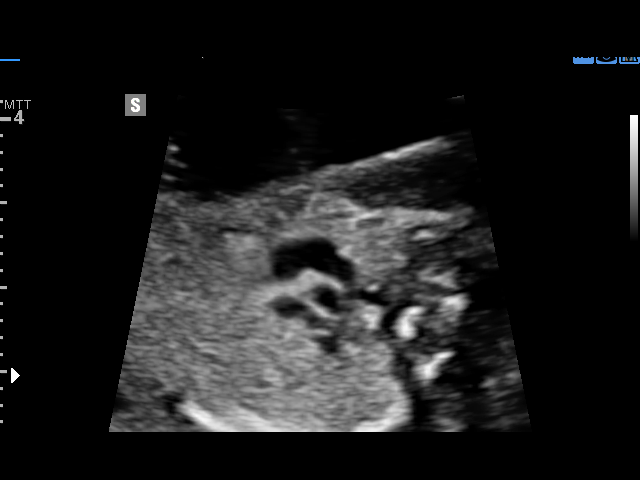
[im 89/134]
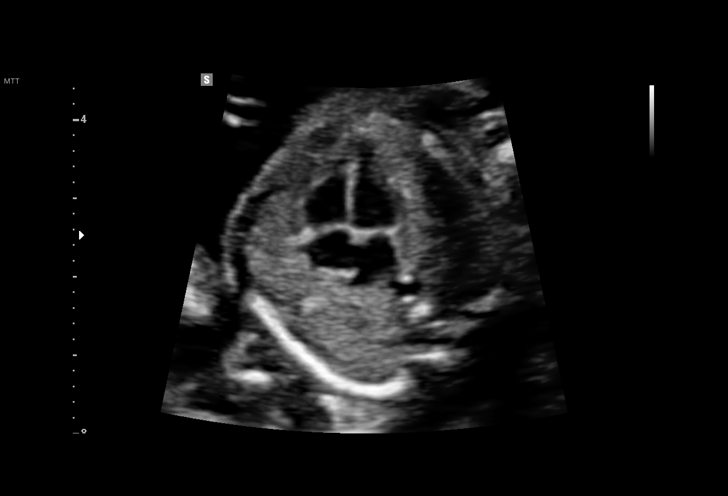
[im 99/134]
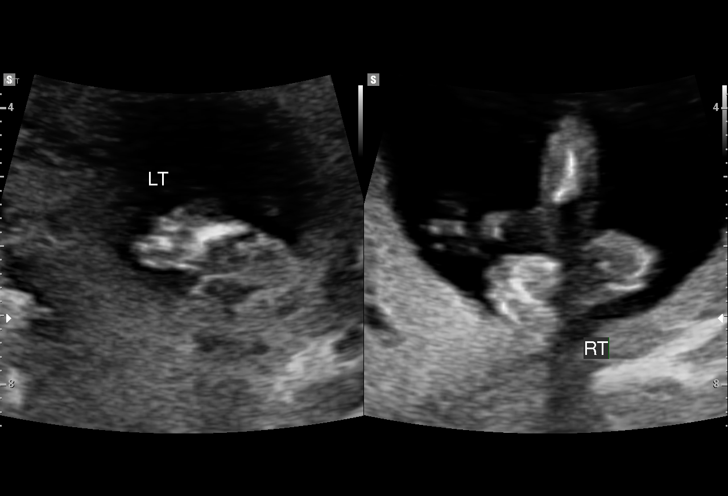
[im 109/134]
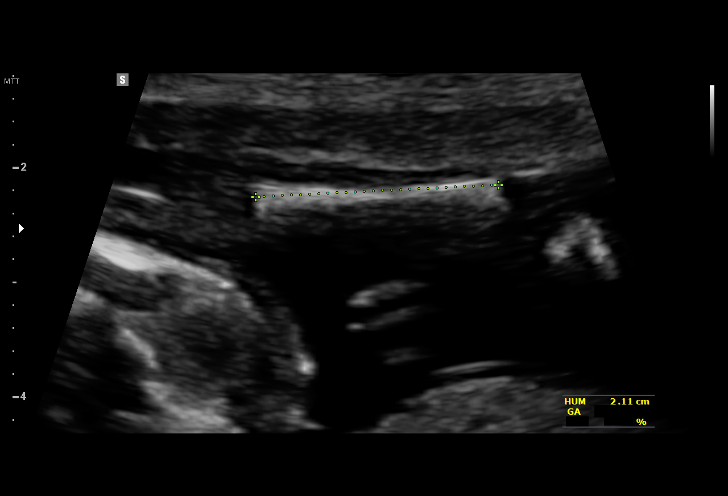
[im 119/134]
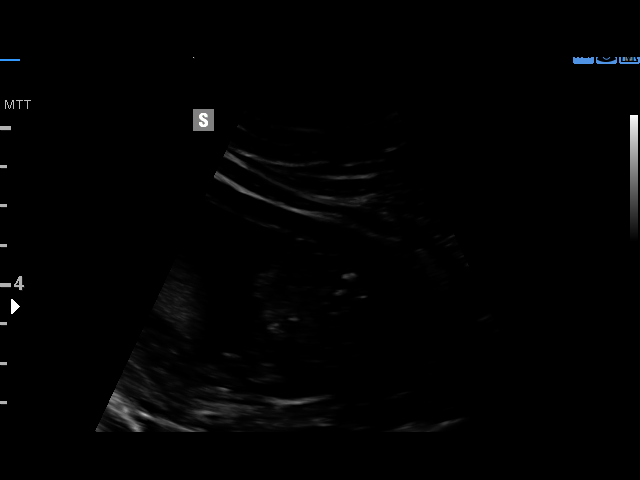
[im 129/134]
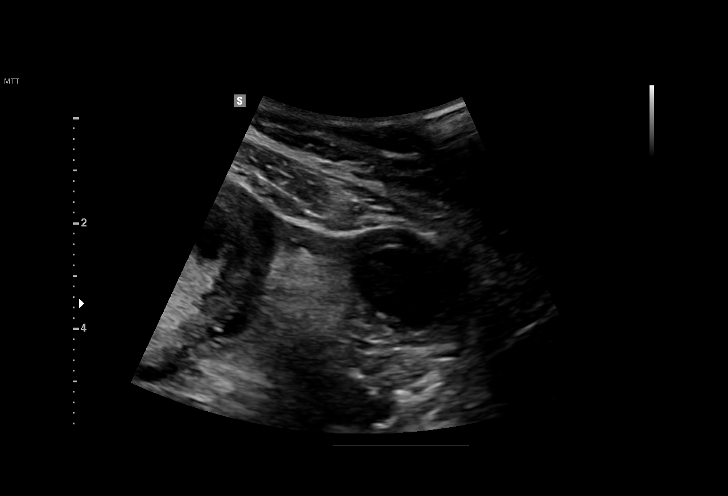

[13 of 28 positions shown; findings below may reference images not displayed]

[REDACTED]-
Faculty Physician

1  TONOH SAHOMBA           [id number]      [id number]     [id number]
Indications

18 weeks gestation of pregnancy
Detailed fetal anatomic survey                 Z36
Advanced maternal age multigravida 35+,
second trimester (low risk Panorama)
Rh negative state in antepartum
OB History

Gravidity:    3         Term:   2        Prem:   0        SAB:   0
TOP:          0       Ectopic:  0        Living: 2
Fetal Evaluation

Num Of Fetuses:     1
Fetal Heart         145
Rate(bpm):
Cardiac Activity:   Observed
Presentation:       Breech
Placenta:           Posterior, above cervical os
P. Cord Insertion:  Visualized, central

Amniotic Fluid
AFI FV:      Subjectively within normal limits

Largest Pocket(cm)
4.8
Biometry

BPD:      38.4  mm     G. Age:  17w 5d         36  %    CI:        72.28   %   70 - 86
FL/HC:      15.6   %   15.8 - 18
HC:      143.7  mm     G. Age:  17w 4d         23  %    HC/AC:      1.17       1.07 -
AC:       123   mm     G. Age:  17w 6d         46  %    FL/BPD:     58.3   %
FL:       22.4  mm     G. Age:  16w 5d          8  %    FL/AC:      18.2   %   20 - 24
HUM:      21.4  mm     G. Age:  16w 3d          9  %
CER:      17.3  mm     G. Age:  17w 2d         34  %
NFT:       4.5  mm
CM:        3.4  mm

Est. FW:     193  gm      0 lb 7 oz     36  %
Gestational Age

LMP:           18w 0d       Date:   01/16/16                 EDD:   10/22/16
U/S Today:     17w 3d                                        EDD:   10/26/16
Best:          18w 0d    Det. By:   LMP  (01/16/16)          EDD:   10/22/16
Anatomy

Cranium:               Appears normal         Aortic Arch:            Appears normal
Cavum:                 Appears normal         Ductal Arch:            Not well visualized
Ventricles:            Appears normal         Diaphragm:              Appears normal
Choroid Plexus:        Appears normal         Stomach:                Appears normal, left
sided
Cerebellum:            Appears normal         Abdomen:                Appears normal
Posterior Fossa:       Appears normal         Abdominal Wall:         Appears nml (cord
insert, abd wall)
Nuchal Fold:           Appears normal         Cord Vessels:           Appears normal (3
vessel cord)
Face:                  Orbits nl; profile not Kidneys:                Appear normal
well visualized
Lips:                  Appears normal         Bladder:                Appears normal
Thoracic:              Appears normal         Spine:                  Appears normal
Heart:                 Appears normal         Upper Extremities:      Appears normal
(4CH, axis, and
situs)
RVOT:                  Appears normal         Lower Extremities:      Appears normal
LVOT:                  Appears normal

Other:  Female gender. Heels and 5th digit visualized.
Cervix Uterus Adnexa

Cervix
Length:            4.2  cm.
Normal appearance by transabdominal scan.

Uterus
No abnormality visualized.

Left Ovary
Size(cm)     3.02  x    3.21   x  1.72      Vol(ml):
Within normal limits. No adnexal mass visualized.

Right Ovary
Size(cm)     2.07  x    1.39   x  1         Vol(ml):
Within normal limits. No adnexal mass visualized.
Cul De Sac:   No free fluid seen.
Adnexa:       No abnormality visualized.
Impression

Single IUP at 18w 0d
Advanced maternal age - low risk NIPT
Limited views of the profile and ductal arch obtained
The remainder of the fetal anatomy appears normal
Posterior placenta without previa
Normal amniotic fluid volume
Recommendations

Recommend follow-up ultrasound examination in 6 weeks for
interval growth and to reevaluate the fetal profile

## 2018-11-30 DIAGNOSIS — Z83438 Family history of other disorder of lipoprotein metabolism and other lipidemia: Secondary | ICD-10-CM | POA: Insufficient documentation

## 2018-12-13 NOTE — L&D Delivery Note (Signed)
LABOR COURSE Patient was admitted for IOL for AMA and history of precipitous second stage.  She did not require cervical ripening and was started on Pitocin. Following her epidural she was AROM'd.  Delivery Note Called to room and patient was complete with desire to push. Head delivered LOT. Loose nuchal cord x 1 loop. Reduced via somersault. Shoulder and body delivered in usual fashion. At 1441 a viable female was delivered via Vaginal, Spontaneous (Presentation:LOT ;LOA).  Infant with spontaneous cry, placed on mother's abdomen, dried and stimulated. Cord clamped x 2 after one-minute delay, and cut by FOB. Cord blood drawn. Placenta delivered spontaneously with gentle cord traction. Appears intact. Fundus firm with massage and Pitocin. Labia, perineum, vagina, and cervix inspected with laceration as described below.    APGAR:9, 10 ; weight: 3654g.   Cord: 3VC with the following complications:N/A.   Cord pH: collection not indicated  Anesthesia:  Epidural Episiotomy: None Lacerations: very superficial 1st degree perineal abrasion, scant but steady bleeding  Suture Repair: 3.0 vicryl Est. Blood Loss (mL): 125  Mom to postpartum.  Baby to Couplet care / Skin to Skin.   Follow up Visit:  Please schedule this patient for Postpartum visit in: 4 weeks with the following provider: Any provider For C/S patients schedule nurse incision check in weeks 2 weeks: no Low risk pregnancy complicated by: AMA Delivery mode:  SVD Anticipated Birth Control:  Declines PP Procedures needed: None  Schedule Integrated BH visit: no  Mallie Snooks, CNM 07/21/19 3:03 PM

## 2018-12-28 ENCOUNTER — Other Ambulatory Visit (INDEPENDENT_AMBULATORY_CARE_PROVIDER_SITE_OTHER): Payer: BLUE CROSS/BLUE SHIELD

## 2018-12-28 VITALS — BP 115/68 | HR 79

## 2018-12-28 DIAGNOSIS — O26859 Spotting complicating pregnancy, unspecified trimester: Secondary | ICD-10-CM

## 2018-12-28 DIAGNOSIS — O360110 Maternal care for anti-D [Rh] antibodies, first trimester, not applicable or unspecified: Secondary | ICD-10-CM | POA: Diagnosis not present

## 2018-12-28 DIAGNOSIS — O26851 Spotting complicating pregnancy, first trimester: Secondary | ICD-10-CM

## 2018-12-28 MED ORDER — RHO D IMMUNE GLOBULIN 1500 UNIT/2ML IJ SOSY
300.0000 ug | PREFILLED_SYRINGE | Freq: Once | INTRAMUSCULAR | Status: AC
  Administered 2018-12-28: 300 ug via INTRAMUSCULAR

## 2018-12-28 NOTE — Progress Notes (Signed)
Pt here stating that she is A neg and is [redacted] weeks pregnant.  She has been having bright red spotting for 3 days and is concerned about whether she should get Rhophylac.  Antibody screen drawn along with BHCG and Rhophylac given LUOQ IM.  Bedside U/S showed a single viable IUP with positive FHT   measuring @ 9 weeks without any noted hemorrhagic areas.  She does have a Left Corpus Luteum noted. She does have a NOB appt on Monday with Dr Marice Potter and will reevaluate at that time.  Pt instructed to call if any further issues or if bleeding gets and heavier.

## 2018-12-28 NOTE — Progress Notes (Signed)
I agree with the nurses note and plan of care.  Duane Lopeasch, Haydon Dorris I, NP 12/28/2018 10:31 AM

## 2018-12-29 ENCOUNTER — Encounter: Payer: Self-pay | Admitting: *Deleted

## 2018-12-29 LAB — ANTIBODY SCREEN: Antibody Screen: NOT DETECTED

## 2018-12-29 LAB — HCG, QUANTITATIVE, PREGNANCY

## 2019-01-01 ENCOUNTER — Ambulatory Visit (INDEPENDENT_AMBULATORY_CARE_PROVIDER_SITE_OTHER): Payer: BLUE CROSS/BLUE SHIELD | Admitting: Obstetrics & Gynecology

## 2019-01-01 DIAGNOSIS — Z124 Encounter for screening for malignant neoplasm of cervix: Secondary | ICD-10-CM

## 2019-01-01 DIAGNOSIS — Z3481 Encounter for supervision of other normal pregnancy, first trimester: Secondary | ICD-10-CM

## 2019-01-01 DIAGNOSIS — N841 Polyp of cervix uteri: Secondary | ICD-10-CM | POA: Insufficient documentation

## 2019-01-01 DIAGNOSIS — Z1151 Encounter for screening for human papillomavirus (HPV): Secondary | ICD-10-CM | POA: Diagnosis not present

## 2019-01-01 DIAGNOSIS — Z113 Encounter for screening for infections with a predominantly sexual mode of transmission: Secondary | ICD-10-CM

## 2019-01-01 DIAGNOSIS — Z3480 Encounter for supervision of other normal pregnancy, unspecified trimester: Secondary | ICD-10-CM

## 2019-01-01 NOTE — Progress Notes (Signed)
Pt continues to have spotting and did have Rhogam last Thursday.  Bedside U/S shows single IUP with FHt of 164 BPM and CRL measures 31.671mm  GA is 3138w1d

## 2019-01-01 NOTE — Addendum Note (Signed)
Addended by: Kathie Dike on: 01/01/2019 10:24 AM   Modules accepted: Orders

## 2019-01-01 NOTE — Addendum Note (Signed)
Addended by: Nicholaus Bloom C on: 01/01/2019 10:20 AM   Modules accepted: Orders

## 2019-01-01 NOTE — Progress Notes (Signed)
  Subjective:    Jordan Larson is being seen today for her first obstetrical visit.  This is a planned pregnancy. She is at [redacted]w[redacted]d gestation. Her obstetrical history is significant for advanced maternal age. Relationship with FOB: spouse, living together. Patient does intend to breast feed. Pregnancy history fully reviewed.  Patient reports no complaints.  Review of Systems:   Review of Systems  Objective:     BP 100/68   Pulse 76   Wt 158 lb (71.7 kg)   LMP 10/22/2018   BMI 24.75 kg/m  Physical Exam  Exam Breathing, conversing, and ambulating normally Well nourished, well hydrated White female, no apparent distress Heart- rrr Lungs- CTAB Abd- benign Cervix- friable with a 1 cm polyp noted. I treated it with silver nitrate.   Assessment:    Pregnancy: [id number] Patient Active Problem List   Diagnosis Date Noted  . Cervical polyp 01/01/2019  . Supervision of other normal pregnancy, antepartum 03/12/2016  . AMA (advanced maternal age) multigravida 35+ 03/12/2016       Plan:     Initial labs drawn. Prenatal vitamins. Problem list reviewed and updated. [facility] NIPS in 2 weeks Role of ultrasound in pregnancy discussed; fetal survey: ordered. Follow up in 2 weeks. Baby Scripts (opt scheduling)  Warned her that she may have spotting from the cervical polyp  Allie Bossier 01/01/2019

## 2019-01-03 LAB — CYTOLOGY - PAP
Chlamydia: NEGATIVE
Diagnosis: NEGATIVE
HPV: NOT DETECTED
Neisseria Gonorrhea: NEGATIVE

## 2019-01-03 LAB — URINE CULTURE, OB REFLEX: Organism ID, Bacteria: NO GROWTH

## 2019-01-03 LAB — CULTURE, OB URINE

## 2019-01-04 NOTE — Addendum Note (Signed)
Addended by: Granville Lewis on: 01/04/2019 01:23 PM   Modules accepted: Orders

## 2019-01-15 ENCOUNTER — Ambulatory Visit (INDEPENDENT_AMBULATORY_CARE_PROVIDER_SITE_OTHER): Payer: BLUE CROSS/BLUE SHIELD | Admitting: Obstetrics & Gynecology

## 2019-01-15 ENCOUNTER — Encounter: Payer: Self-pay | Admitting: *Deleted

## 2019-01-15 VITALS — BP 110/66 | HR 65 | Wt 156.0 lb

## 2019-01-15 DIAGNOSIS — Z3480 Encounter for supervision of other normal pregnancy, unspecified trimester: Secondary | ICD-10-CM

## 2019-01-15 DIAGNOSIS — Z3481 Encounter for supervision of other normal pregnancy, first trimester: Secondary | ICD-10-CM | POA: Diagnosis not present

## 2019-01-15 LAB — GLUCOSE, POCT (MANUAL RESULT ENTRY): POC Glucose: 90 mg/dl (ref 70–99)

## 2019-01-15 NOTE — Progress Notes (Signed)
   PRENATAL VISIT NOTE  Subjective:  Jordan Larson is a 38 y.o. G4P3003 at [redacted]w[redacted]d being seen today for ongoing prenatal care.  She is currently monitored for the following issues for this high-risk pregnancy and has Supervision of other normal pregnancy, antepartum; AMA (advanced maternal age) multigravida 35+; and Cervical polyp on their problem list.  Patient reports got over viral gastroenteritis.  Contractions: Not present. Vag. Bleeding: None.  Movement: Absent. Denies leaking of fluid.   The following portions of the patient's history were reviewed and updated as appropriate: allergies, current medications, past family history, past medical history, past social history, past surgical history and problem list. Problem list updated.  Objective:  There were no vitals filed for this visit.  Fetal Status:     Movement: Absent     General:  Alert, oriented and cooperative. Patient is in no acute distress.  Skin: Skin is warm and dry. No rash noted.   Cardiovascular: Normal heart rate noted  Respiratory: Normal respiratory effort, no problems with respiration noted  Abdomen: Soft, gravid, appropriate for gestational age.  Pain/Pressure: Absent     Pelvic: Cervical exam deferred        Extremities: Normal range of motion.  Edema: None  Mental Status: Normal mood and affect. Normal behavior. Normal judgment and thought content.   Assessment and Plan:  Pregnancy: G4P3003 at [redacted]w[redacted]d  1. Supervision of other normal pregnancy, antepartum - Obstetric panel - HIV antibody (with reflex) - Genetic Screening  2.  Cervical Polyp Bleeding stopped a week after exam.  Pt can try intercourse.  If bleeding heavy, then intercourse may not be possible during pregnancy.    Preterm labor symptoms and general obstetric precautions including but not limited to vaginal bleeding, contractions, leaking of fluid and fetal movement were reviewed in detail with the patient. Please refer to After Visit Summary  for other counseling recommendations.  No follow-ups on file.  Future Appointments  Date Time Provider Department Center  01/15/2019 11:00 AM Lesly Dukes, MD [facility]  03/05/2019  9:30 AM [facility] [place] 1 [facility]    Elsie Lincoln, MD

## 2019-01-16 LAB — OBSTETRIC PANEL
Absolute Monocytes: 347 cells/uL (ref 200–950)
Antibody Screen: POSITIVE — AB
Basophils Absolute: 27 cells/uL (ref 0–200)
Basophils Relative: 0.4 %
Eosinophils Absolute: 41 cells/uL (ref 15–500)
Eosinophils Relative: 0.6 %
HCT: 38.6 % (ref 35.0–45.0)
Hemoglobin: 13.6 g/dL (ref 11.7–15.5)
Hepatitis B Surface Ag: NONREACTIVE
Lymphs Abs: 1523 cells/uL (ref 850–3900)
MCH: 31.9 pg (ref 27.0–33.0)
MCHC: 35.2 g/dL (ref 32.0–36.0)
MCV: 90.6 fL (ref 80.0–100.0)
MONOS PCT: 5.1 %
MPV: 9.7 fL (ref 7.5–12.5)
Neutro Abs: 4862 cells/uL (ref 1500–7800)
Neutrophils Relative %: 71.5 %
Platelets: 191 10*3/uL (ref 140–400)
RBC: 4.26 10*6/uL (ref 3.80–5.10)
RDW: 12.8 % (ref 11.0–15.0)
RPR Ser Ql: NONREACTIVE
Rubella: 4.76 index
Total Lymphocyte: 22.4 %
WBC: 6.8 10*3/uL (ref 3.8–10.8)

## 2019-01-16 LAB — COMPREHENSIVE METABOLIC PANEL
AG Ratio: 1.8 (calc) (ref 1.0–2.5)
ALT: 13 U/L (ref 6–29)
AST: 15 U/L (ref 10–30)
Albumin: 3.9 g/dL (ref 3.6–5.1)
Alkaline phosphatase (APISO): 44 U/L (ref 31–125)
BUN/Creatinine Ratio: 8 (calc) (ref 6–22)
BUN: 4 mg/dL — ABNORMAL LOW (ref 7–25)
CO2: 28 mmol/L (ref 20–32)
Calcium: 8.8 mg/dL (ref 8.6–10.2)
Chloride: 101 mmol/L (ref 98–110)
Creat: 0.5 mg/dL (ref 0.50–1.10)
Globulin: 2.2 g/dL (calc) (ref 1.9–3.7)
Glucose, Bld: 64 mg/dL — ABNORMAL LOW (ref 65–99)
Potassium: 3.1 mmol/L — ABNORMAL LOW (ref 3.5–5.3)
Sodium: 135 mmol/L (ref 135–146)
Total Bilirubin: 0.4 mg/dL (ref 0.2–1.2)
Total Protein: 6.1 g/dL (ref 6.1–8.1)

## 2019-01-16 LAB — ANTIBODY ID, TITER, AND TYPING, RBC

## 2019-01-16 LAB — HEMOGLOBIN A1C
HEMOGLOBIN A1C: 4.8 %{Hb} (ref <5.7)
Mean Plasma Glucose: 91 (calc)
eAG (mmol/L): 5 (calc)

## 2019-01-16 LAB — HIV ANTIBODY (ROUTINE TESTING W REFLEX): HIV 1&2 Ab, 4th Generation: NONREACTIVE

## 2019-01-22 ENCOUNTER — Encounter: Payer: Self-pay | Admitting: *Deleted

## 2019-02-26 ENCOUNTER — Encounter (HOSPITAL_COMMUNITY): Payer: Self-pay

## 2019-03-05 ENCOUNTER — Ambulatory Visit (HOSPITAL_COMMUNITY)
Admission: RE | Admit: 2019-03-05 | Discharge: 2019-03-05 | Disposition: A | Discharge status: Home / Self Care | Payer: BLUE CROSS/BLUE SHIELD | Source: Ambulatory Visit | Attending: Obstetrics & Gynecology | Admitting: Obstetrics & Gynecology

## 2019-03-05 ENCOUNTER — Encounter (HOSPITAL_COMMUNITY): Payer: Self-pay

## 2019-03-05 ENCOUNTER — Other Ambulatory Visit: Payer: Self-pay

## 2019-03-05 ENCOUNTER — Ambulatory Visit (HOSPITAL_COMMUNITY): Payer: BLUE CROSS/BLUE SHIELD | Admitting: *Deleted

## 2019-03-05 VITALS — Temp 97.9°F

## 2019-03-05 DIAGNOSIS — O09522 Supervision of elderly multigravida, second trimester: Secondary | ICD-10-CM

## 2019-03-05 DIAGNOSIS — Z363 Encounter for antenatal screening for malformations: Secondary | ICD-10-CM

## 2019-03-05 DIAGNOSIS — Z3480 Encounter for supervision of other normal pregnancy, unspecified trimester: Secondary | ICD-10-CM | POA: Insufficient documentation

## 2019-03-05 DIAGNOSIS — O09523 Supervision of elderly multigravida, third trimester: Secondary | ICD-10-CM | POA: Diagnosis present

## 2019-03-05 DIAGNOSIS — Z3A19 19 weeks gestation of pregnancy: Secondary | ICD-10-CM | POA: Diagnosis not present

## 2019-03-12 ENCOUNTER — Ambulatory Visit (INDEPENDENT_AMBULATORY_CARE_PROVIDER_SITE_OTHER): Payer: BLUE CROSS/BLUE SHIELD | Admitting: Obstetrics & Gynecology

## 2019-03-12 ENCOUNTER — Other Ambulatory Visit: Payer: Self-pay

## 2019-03-12 VITALS — BP 107/72 | HR 66 | Wt 160.0 lb

## 2019-03-12 DIAGNOSIS — O09522 Supervision of elderly multigravida, second trimester: Secondary | ICD-10-CM

## 2019-03-12 DIAGNOSIS — Z3A20 20 weeks gestation of pregnancy: Secondary | ICD-10-CM

## 2019-03-12 DIAGNOSIS — Z3480 Encounter for supervision of other normal pregnancy, unspecified trimester: Secondary | ICD-10-CM

## 2019-03-12 NOTE — Progress Notes (Signed)
   PRENATAL VISIT NOTE  Subjective:  Jordan Larson is a 38 y.o. [phone] at [redacted]w[redacted]d being seen today for ongoing prenatal care.  She is currently monitored for the following issues for this low-risk pregnancy and has Supervision of other normal pregnancy, antepartum; AMA (advanced maternal age) multigravida 35+; Cervical polyp; Family history of hyperlipidemia; GAD (generalized anxiety disorder); and Vitamin D deficiency on their problem list.  Patient reports no complaints.  Contractions: Not present. Vag. Bleeding: None.  Movement: Present. Denies leaking of fluid.   The following portions of the patient's history were reviewed and updated as appropriate: allergies, current medications, past family history, past medical history, past social history, past surgical history and problem list.   Objective:   Vitals:   03/12/19 1100  BP: 107/72  Pulse: 66  Weight: 160 lb (72.6 kg)    Fetal Status: Fetal Heart Rate (bpm): 148   Movement: Present     General:  Alert, oriented and cooperative. Patient is in no acute distress.  Skin: Skin is warm and dry. No rash noted.   Cardiovascular: Normal heart rate noted  Respiratory: Normal respiratory effort, no problems with respiration noted  Abdomen: Soft, gravid, appropriate for gestational age.  Pain/Pressure: Absent     Pelvic: Cervical exam deferred        Extremities: Normal range of motion.  Edema: None  Mental Status: Normal mood and affect. Normal behavior. Normal judgment and thought content.   Assessment and Plan:  Pregnancy: G4P3003 at [redacted]w[redacted]d 1. Supervision of other normal pregnancy, antepartum  - Alpha fetoprotein, maternal  2. Multigravida of advanced maternal age in second trimester  Preterm labor symptoms and general obstetric precautions including but not limited to vaginal bleeding, contractions, leaking of fluid and fetal movement were reviewed in detail with the patient. Please refer to After Visit Summary for other  counseling recommendations.   Return in about 8 weeks (around 05/07/2019) for 2 hour GTT at next visit.  No future appointments.  Allie Bossier, MD

## 2019-03-13 LAB — ALPHA FETOPROTEIN, MATERNAL
AFP MoM: 1.48
AFP, Serum: 81 ng/mL
Calc'd Gestational Age: 20.1 weeks
Maternal Wt: 160 [lb_av]
Risk for ONTD: 1
Twins-AFP: 1

## 2019-05-09 ENCOUNTER — Ambulatory Visit (INDEPENDENT_AMBULATORY_CARE_PROVIDER_SITE_OTHER): Payer: BLUE CROSS/BLUE SHIELD | Admitting: Obstetrics and Gynecology

## 2019-05-09 ENCOUNTER — Other Ambulatory Visit: Payer: Self-pay

## 2019-05-09 VITALS — BP 114/73 | HR 70 | Wt 167.0 lb

## 2019-05-09 DIAGNOSIS — Z3A28 28 weeks gestation of pregnancy: Secondary | ICD-10-CM | POA: Diagnosis not present

## 2019-05-09 DIAGNOSIS — Z3480 Encounter for supervision of other normal pregnancy, unspecified trimester: Secondary | ICD-10-CM

## 2019-05-09 DIAGNOSIS — O360130 Maternal care for anti-D [Rh] antibodies, third trimester, not applicable or unspecified: Secondary | ICD-10-CM | POA: Diagnosis not present

## 2019-05-09 DIAGNOSIS — O0990 Supervision of high risk pregnancy, unspecified, unspecified trimester: Secondary | ICD-10-CM

## 2019-05-09 DIAGNOSIS — Z6791 Unspecified blood type, Rh negative: Secondary | ICD-10-CM

## 2019-05-09 MED ORDER — RHO D IMMUNE GLOBULIN 1500 UNIT/2ML IJ SOSY
300.0000 ug | PREFILLED_SYRINGE | Freq: Once | INTRAMUSCULAR | Status: AC
  Administered 2019-05-09: 11:00:00 300 ug via INTRAMUSCULAR

## 2019-05-09 NOTE — Progress Notes (Signed)
   PRENATAL VISIT NOTE  Subjective:  Jordan Larson is a 38 y.o. [phone] at [redacted]w[redacted]d being seen today for ongoing prenatal care.  She is currently monitored for the following issues for this low-risk pregnancy and has Supervision of high risk pregnancy, antepartum; AMA (advanced maternal age) multigravida 35+; Rh negative status during pregnancy; Cervical polyp; Family history of hyperlipidemia; GAD (generalized anxiety disorder); and Vitamin D deficiency on their problem list.  Patient reports no complaints.  Contractions: Not present. Vag. Bleeding: None.  Movement: Present. Denies leaking of fluid.   The following portions of the patient's history were reviewed and updated as appropriate: allergies, current medications, past family history, past medical history, past social history, past surgical history and problem list.   Objective:   Vitals:   05/09/19 0928  BP: 114/73  Pulse: 70  Weight: 167 lb (75.8 kg)    Fetal Status:   Fundal Height: 27 cm Movement: Present     General:  Alert, oriented and cooperative. Patient is in no acute distress.  Skin: Skin is warm and dry. No rash noted.   Cardiovascular: Normal heart rate noted  Respiratory: Normal respiratory effort, no problems with respiration noted  Abdomen: Soft, gravid, appropriate for gestational age.  Pain/Pressure: Absent     Pelvic: Cervical exam deferred        Extremities: Normal range of motion.  Edema: None  Mental Status: Normal mood and affect. Normal behavior. Normal judgment and thought content.   Assessment and Plan:  Pregnancy: G4P3003 at [redacted]w[redacted]d 1. Supervision of other normal pregnancy, antepartum  - 2Hr GTT w/ 1 Hr Carpenter 75 g - HIV antibody (with reflex) - CBC - RPR - Antibody screen  2. Rh negative status during pregnancy in third trimester  Rhogam today   Preterm labor symptoms and general obstetric precautions including but not limited to vaginal bleeding, contractions, leaking of fluid and  fetal movement were reviewed in detail with the patient. Please refer to After Visit Summary for other counseling recommendations.   Return in about 4 weeks (around 06/06/2019) for Virtual visit .  No future appointments.  Venia Carbon, NP

## 2019-05-10 LAB — CBC
HCT: 35.3 % (ref 35.0–45.0)
Hemoglobin: 11.6 g/dL — ABNORMAL LOW (ref 11.7–15.5)
MCH: 30.4 pg (ref 27.0–33.0)
MCHC: 32.9 g/dL (ref 32.0–36.0)
MCV: 92.7 fL (ref 80.0–100.0)
MPV: 9.6 fL (ref 7.5–12.5)
Platelets: 199 10*3/uL (ref 140–400)
RBC: 3.81 10*6/uL (ref 3.80–5.10)
RDW: 12.4 % (ref 11.0–15.0)
WBC: 9.2 10*3/uL (ref 3.8–10.8)

## 2019-05-10 LAB — RPR: RPR Ser Ql: NONREACTIVE

## 2019-05-10 LAB — 2HR GTT W 1 HR, CARPENTER, 75 G
Glucose, 1 Hr, Gest: 128 mg/dL (ref 65–179)
Glucose, 2 Hr, Gest: 56 mg/dL — ABNORMAL LOW (ref 65–152)
Glucose, Fasting, Gest: 73 mg/dL (ref 65–91)

## 2019-05-10 LAB — HIV ANTIBODY (ROUTINE TESTING W REFLEX): HIV 1&2 Ab, 4th Generation: NONREACTIVE

## 2019-05-10 LAB — ANTIBODY SCREEN: Antibody Screen: NOT DETECTED

## 2019-06-06 ENCOUNTER — Ambulatory Visit (INDEPENDENT_AMBULATORY_CARE_PROVIDER_SITE_OTHER): Payer: BC Managed Care – PPO | Admitting: Certified Nurse Midwife

## 2019-06-06 ENCOUNTER — Other Ambulatory Visit: Payer: Self-pay

## 2019-06-06 VITALS — BP 96/68 | HR 72 | Wt 169.0 lb

## 2019-06-06 DIAGNOSIS — O0990 Supervision of high risk pregnancy, unspecified, unspecified trimester: Secondary | ICD-10-CM

## 2019-06-06 DIAGNOSIS — O0993 Supervision of high risk pregnancy, unspecified, third trimester: Secondary | ICD-10-CM

## 2019-06-06 DIAGNOSIS — Z3A32 32 weeks gestation of pregnancy: Secondary | ICD-10-CM

## 2019-06-06 NOTE — Progress Notes (Signed)
   [facility] VIRTUAL VIDEO VISIT ENCOUNTER NOTE  Provider location: [facility] for [place] [facility] [place]   I connected with Dannial Monarch on 06/06/19 at  9:45 AM EDT by WebEx Video Encounter at home and verified that I am speaking with the correct person using two identifiers.   I discussed the limitations, risks, security and privacy concerns of performing an evaluation and management service by telephone and the availability of in person appointments. I also discussed with the patient that there may be a patient responsible charge related to this service. The patient expressed understanding and agreed to proceed. Subjective:  Jordan Larson is a 38 y.o. [phone] at [redacted]w[redacted]d being seen today for ongoing prenatal care.  She is currently monitored for the following issues for this low-risk pregnancy and has Supervision of high risk pregnancy, antepartum; AMA (advanced maternal age) multigravida 51+; Rh negative status during pregnancy; Cervical polyp; Family history of hyperlipidemia; GAD (generalized anxiety disorder); and Vitamin D deficiency on their problem list.  Patient reports no complaints.  Contractions: Irritability. Vag. Bleeding: None.  Movement: Present. Denies any leaking of fluid.   The following portions of the patient's history were reviewed and updated as appropriate: allergies, current medications, past family history, past medical history, past social history, past surgical history and problem list.   Objective:   Vitals:   06/06/19 0937  BP: 96/68  Pulse: 72  Weight: 76.7 kg    Fetal Status:     Movement: Present     General:  Alert, oriented and cooperative. Patient is in no acute distress.  Respiratory: Normal respiratory effort, no problems with respiration noted  Mental Status: Normal mood and affect. Normal behavior. Normal judgment and thought content.  Rest of physical exam deferred due to type of encounter  Imaging: No results  found.  Assessment and Plan:  Pregnancy: G4P3003 at [redacted]w[redacted]d 1. Supervision of high risk pregnancy, antepartum - GBS next visit in person  Preterm labor symptoms and general obstetric precautions including but not limited to vaginal bleeding, contractions, leaking of fluid and fetal movement were reviewed in detail with the patient. I discussed the assessment and treatment plan with the patient. The patient was provided an opportunity to ask questions and all were answered. The patient agreed with the plan and demonstrated an understanding of the instructions. The patient was advised to call back or seek an in-person office evaluation/go to [facility]U at [facility] for any urgent or concerning symptoms. Please refer to After Visit Summary for other counseling recommendations.   I provided 5 minutes of face-to-face time during this encounter.  No follow-ups on file.  Future Appointments  Date Time Provider [place]  07/02/2019  2:00 PM Emily Filbert, MD [place], [place] for [employer], [place]

## 2019-07-02 ENCOUNTER — Encounter: Payer: BC Managed Care – PPO | Admitting: Obstetrics & Gynecology

## 2019-07-02 ENCOUNTER — Encounter (HOSPITAL_COMMUNITY): Payer: Self-pay

## 2019-07-02 ENCOUNTER — Inpatient Hospital Stay (HOSPITAL_COMMUNITY)
Admission: AD | Admit: 2019-07-02 | Discharge: 2019-07-02 | Disposition: A | Discharge status: Home / Self Care | Payer: BC Managed Care – PPO | Source: Ambulatory Visit | Attending: Family Medicine | Admitting: Family Medicine

## 2019-07-02 ENCOUNTER — Inpatient Hospital Stay (HOSPITAL_COMMUNITY)
Admission: AD | Admit: 2019-07-02 | Discharge: 2019-07-02 | Disposition: A | Discharge status: Home / Self Care | Payer: BC Managed Care – PPO | Source: Home / Self Care | Attending: Obstetrics & Gynecology | Admitting: Obstetrics & Gynecology

## 2019-07-02 ENCOUNTER — Other Ambulatory Visit: Payer: Self-pay

## 2019-07-02 DIAGNOSIS — O0993 Supervision of high risk pregnancy, unspecified, third trimester: Secondary | ICD-10-CM

## 2019-07-02 DIAGNOSIS — N841 Polyp of cervix uteri: Secondary | ICD-10-CM

## 2019-07-02 DIAGNOSIS — Z3A36 36 weeks gestation of pregnancy: Secondary | ICD-10-CM | POA: Diagnosis not present

## 2019-07-02 DIAGNOSIS — O479 False labor, unspecified: Secondary | ICD-10-CM

## 2019-07-02 DIAGNOSIS — O4703 False labor before 37 completed weeks of gestation, third trimester: Secondary | ICD-10-CM | POA: Insufficient documentation

## 2019-07-02 DIAGNOSIS — O0990 Supervision of high risk pregnancy, unspecified, unspecified trimester: Secondary | ICD-10-CM

## 2019-07-02 HISTORY — DX: Polyp of cervix uteri: N84.1

## 2019-07-02 LAB — URINALYSIS, ROUTINE W REFLEX MICROSCOPIC
Bilirubin Urine: NEGATIVE
Glucose, UA: NEGATIVE mg/dL
Ketones, ur: NEGATIVE mg/dL
Leukocytes,Ua: NEGATIVE
Nitrite: NEGATIVE
Protein, ur: NEGATIVE mg/dL
Specific Gravity, Urine: 1.001 — ABNORMAL LOW (ref 1.005–1.030)
pH: 7 (ref 5.0–8.0)

## 2019-07-02 NOTE — MAU Note (Signed)
I have communicated with Derrill Memo, CNM and reviewed vital signs:  Vitals:   07/02/19 0846 07/02/19 1027  BP: (!) 106/54 113/73  Pulse: 72   Resp:    Temp:    SpO2:      Vaginal exam:  Dilation: 3.5 Effacement (%): 60 Cervical Position: Middle Station: Ballotable Presentation: Vertex Exam by:: Frances Maywood RN ,   Also reviewed contraction pattern and that non-stress test is reactive.  It has been documented that patient is contracting every 3-4 minutes with no cervical change over 1.5  hours not indicating active labor.  Patient denies any other complaints.  Based on this report provider has given order for discharge.  A discharge order and diagnosis entered by a provider.   Labor discharge instructions reviewed with patient.

## 2019-07-02 NOTE — MAU Note (Signed)
Pt reports she has had ctx on and off since 10pm. About 4-5 min apart.Denies any vag bleeding or leaking. Good fetal movement.

## 2019-07-02 NOTE — MAU Provider Note (Signed)
S: Ms. Jordan Larson is a 38 y.o. [phone] at [redacted]w[redacted]d  who presents to MAU today complaining contractions q 4-5 minutes since 2200. She denies vaginal bleeding. She denies LOF. She reports normal fetal movement.    O: BP 125/63 (BP Location: Right Arm)   Pulse 70   Temp 98.2 F (36.8 C)   Resp 18   Ht 5\' 8"  (1.727 m)   Wt 80.3 kg   LMP 10/22/2018   BMI 26.91 kg/m  GENERAL: Well-developed, well-nourished female in no acute distress.  HEAD: Normocephalic, atraumatic.  CHEST: Normal effort of breathing, regular heart rate ABDOMEN: Soft, nontender, gravid  Cervical exam:  Dilation: 3.5 Effacement (%): 60(60) Cervical Position: Posterior Station: -2 Presentation: Vertex Exam by:: Gilmer Mor RN   Fetal Monitoring: Baseline: 125 Variability: moderate Accelerations: 15x15 Decelerations: none Contractions: 2-5  Patient reports a history of precipitous labors. Patient observed for 2+ hours and cervix essentially unchanged.  A: SIUP at [redacted]w[redacted]d  False labor  P: Discharge home Labor precautions reviewed Patient to keep appointment at [facility] at 1400. Patient can return to MAU as needed  Wende Mott, [place] 07/02/2019 5:14 AM

## 2019-07-02 NOTE — MAU Note (Signed)
Fundal height measures 36cm

## 2019-07-02 NOTE — Discharge Instructions (Signed)
Braxton Hicks Contractions Contractions of the uterus can occur throughout pregnancy, but they are not always a sign that you are in labor. You may have practice contractions called Braxton Hicks contractions. These false labor contractions are sometimes confused with true labor. What are Braxton Hicks contractions? Braxton Hicks contractions are tightening movements that occur in the muscles of the uterus before labor. Unlike true labor contractions, these contractions do not result in opening (dilation) and thinning of the cervix. Toward the end of pregnancy (32-34 weeks), Braxton Hicks contractions can happen more often and may become stronger. These contractions are sometimes difficult to tell apart from true labor because they can be very uncomfortable. You should not feel embarrassed if you go to the hospital with false labor. Sometimes, the only way to tell if you are in true labor is for your health care provider to look for changes in the cervix. The health care provider will do a physical exam and may monitor your contractions. If you are not in true labor, the exam should show that your cervix is not dilating and your water has not broken. If there are no other health problems associated with your pregnancy, it is completely safe for you to be sent home with false labor. You may continue to have Braxton Hicks contractions until you go into true labor. How to tell the difference between true labor and false labor True labor  Contractions last 30-70 seconds.  Contractions become very regular.  Discomfort is usually felt in the top of the uterus, and it spreads to the lower abdomen and low back.  Contractions do not go away with walking.  Contractions usually become more intense and increase in frequency.  The cervix dilates and gets thinner. False labor  Contractions are usually shorter and not as strong as true labor contractions.  Contractions are usually irregular.  Contractions  are often felt in the front of the lower abdomen and in the groin.  Contractions may go away when you walk around or change positions while lying down.  Contractions get weaker and are shorter-lasting as time goes on.  The cervix usually does not dilate or become thin. Follow these instructions at home:   Take over-the-counter and prescription medicines only as told by your health care provider.  Keep up with your usual exercises and follow other instructions from your health care provider.  Eat and drink lightly if you think you are going into labor.  If Braxton Hicks contractions are making you uncomfortable: ? Change your position from lying down or resting to walking, or change from walking to resting. ? Sit and rest in a tub of warm water. ? Drink enough fluid to keep your urine pale yellow. Dehydration may cause these contractions. ? Do slow and deep breathing several times an hour.  Keep all follow-up prenatal visits as told by your health care provider. This is important. Contact a health care provider if:  You have a fever.  You have continuous pain in your abdomen. Get help right away if:  Your contractions become stronger, more regular, and closer together.  You have fluid leaking or gushing from your vagina.  You pass blood-tinged mucus (bloody show).  You have bleeding from your vagina.  You have low back pain that you never had before.  You feel your baby's head pushing down and causing pelvic pressure.  Your baby is not moving inside you as much as it used to. Summary  Contractions that occur before labor are   called Braxton Hicks contractions, false labor, or practice contractions.  Braxton Hicks contractions are usually shorter, weaker, farther apart, and less regular than true labor contractions. True labor contractions usually become progressively stronger and regular, and they become more frequent.  Manage discomfort from Braxton Hicks contractions  by changing position, resting in a warm bath, drinking plenty of water, or practicing deep breathing. This information is not intended to replace advice given to you by your health care provider. Make sure you discuss any questions you have with your health care provider. Document Released: 04/14/2017 Document Revised: 11/11/2017 Document Reviewed: 04/14/2017 Elsevier Patient Education  2020 [employer]  

## 2019-07-04 ENCOUNTER — Encounter (HOSPITAL_COMMUNITY): Payer: Self-pay

## 2019-07-04 ENCOUNTER — Other Ambulatory Visit: Payer: Self-pay

## 2019-07-04 ENCOUNTER — Inpatient Hospital Stay (HOSPITAL_COMMUNITY)
Admission: AD | Admit: 2019-07-04 | Discharge: 2019-07-04 | Disposition: A | Discharge status: Home / Self Care | Payer: BC Managed Care – PPO | Attending: Obstetrics & Gynecology | Admitting: Obstetrics & Gynecology

## 2019-07-04 DIAGNOSIS — N841 Polyp of cervix uteri: Secondary | ICD-10-CM

## 2019-07-04 DIAGNOSIS — Z3A00 Weeks of gestation of pregnancy not specified: Secondary | ICD-10-CM | POA: Diagnosis not present

## 2019-07-04 DIAGNOSIS — O479 False labor, unspecified: Secondary | ICD-10-CM | POA: Insufficient documentation

## 2019-07-04 DIAGNOSIS — O0990 Supervision of high risk pregnancy, unspecified, unspecified trimester: Secondary | ICD-10-CM

## 2019-07-04 LAB — CULTURE, BETA STREP (GROUP B ONLY)

## 2019-07-04 NOTE — Discharge Instructions (Signed)
Braxton Hicks Contractions Contractions of the uterus can occur throughout pregnancy, but they are not always a sign that you are in labor. You may have practice contractions called Braxton Hicks contractions. These false labor contractions are sometimes confused with true labor. What are Braxton Hicks contractions? Braxton Hicks contractions are tightening movements that occur in the muscles of the uterus before labor. Unlike true labor contractions, these contractions do not result in opening (dilation) and thinning of the cervix. Toward the end of pregnancy (32-34 weeks), Braxton Hicks contractions can happen more often and may become stronger. These contractions are sometimes difficult to tell apart from true labor because they can be very uncomfortable. You should not feel embarrassed if you go to the hospital with false labor. Sometimes, the only way to tell if you are in true labor is for your health care provider to look for changes in the cervix. The health care provider will do a physical exam and may monitor your contractions. If you are not in true labor, the exam should show that your cervix is not dilating and your water has not broken. If there are no other health problems associated with your pregnancy, it is completely safe for you to be sent home with false labor. You may continue to have Braxton Hicks contractions until you go into true labor. How to tell the difference between true labor and false labor True labor  Contractions last 30-70 seconds.  Contractions become very regular.  Discomfort is usually felt in the top of the uterus, and it spreads to the lower abdomen and low back.  Contractions do not go away with walking.  Contractions usually become more intense and increase in frequency.  The cervix dilates and gets thinner. False labor  Contractions are usually shorter and not as strong as true labor contractions.  Contractions are usually irregular.  Contractions  are often felt in the front of the lower abdomen and in the groin.  Contractions may go away when you walk around or change positions while lying down.  Contractions get weaker and are shorter-lasting as time goes on.  The cervix usually does not dilate or become thin. Follow these instructions at home:   Take over-the-counter and prescription medicines only as told by your health care provider.  Keep up with your usual exercises and follow other instructions from your health care provider.  Eat and drink lightly if you think you are going into labor.  If Braxton Hicks contractions are making you uncomfortable: ? Change your position from lying down or resting to walking, or change from walking to resting. ? Sit and rest in a tub of warm water. ? Drink enough fluid to keep your urine pale yellow. Dehydration may cause these contractions. ? Do slow and deep breathing several times an hour.  Keep all follow-up prenatal visits as told by your health care provider. This is important. Contact a health care provider if:  You have a fever.  You have continuous pain in your abdomen. Get help right away if:  Your contractions become stronger, more regular, and closer together.  You have fluid leaking or gushing from your vagina.  You pass blood-tinged mucus (bloody show).  You have bleeding from your vagina.  You have low back pain that you never had before.  You feel your baby's head pushing down and causing pelvic pressure.  Your baby is not moving inside you as much as it used to. Summary  Contractions that occur before labor are   called Braxton Hicks contractions, false labor, or practice contractions.  Braxton Hicks contractions are usually shorter, weaker, farther apart, and less regular than true labor contractions. True labor contractions usually become progressively stronger and regular, and they become more frequent.  Manage discomfort from Braxton Hicks contractions  by changing position, resting in a warm bath, drinking plenty of water, or practicing deep breathing. This information is not intended to replace advice given to you by your health care provider. Make sure you discuss any questions you have with your health care provider. Document Released: 04/14/2017 Document Revised: 11/11/2017 Document Reviewed: 04/14/2017 Elsevier Patient Education  2020 [employer]  

## 2019-07-04 NOTE — MAU Note (Signed)
Pt reports ctx on and off since Sunday. Here on Monday and she was 4cm. Ctx stronger today about 2-4 min apart. Reports some bloody show. Good fetal movement felt.

## 2019-07-08 ENCOUNTER — Other Ambulatory Visit: Payer: Self-pay

## 2019-07-08 ENCOUNTER — Inpatient Hospital Stay (HOSPITAL_COMMUNITY)
Admission: AD | Admit: 2019-07-08 | Discharge: 2019-07-09 | Disposition: A | Discharge status: Home / Self Care | Payer: BC Managed Care – PPO | Attending: Obstetrics & Gynecology | Admitting: Obstetrics & Gynecology

## 2019-07-08 ENCOUNTER — Encounter (HOSPITAL_COMMUNITY): Payer: Self-pay | Admitting: *Deleted

## 2019-07-08 DIAGNOSIS — N841 Polyp of cervix uteri: Secondary | ICD-10-CM

## 2019-07-08 DIAGNOSIS — O479 False labor, unspecified: Secondary | ICD-10-CM

## 2019-07-08 DIAGNOSIS — O471 False labor at or after 37 completed weeks of gestation: Secondary | ICD-10-CM | POA: Insufficient documentation

## 2019-07-08 DIAGNOSIS — O0990 Supervision of high risk pregnancy, unspecified, unspecified trimester: Secondary | ICD-10-CM

## 2019-07-08 DIAGNOSIS — Z3689 Encounter for other specified antenatal screening: Secondary | ICD-10-CM

## 2019-07-08 DIAGNOSIS — Z3A37 37 weeks gestation of pregnancy: Secondary | ICD-10-CM | POA: Insufficient documentation

## 2019-07-08 NOTE — MAU Note (Signed)
Pt reports u/c's q3 min apart since 2000 tonight. Says she has had bloody show for the past three days but none tonight. Pos fetal movement, no leaking of fluid

## 2019-07-09 ENCOUNTER — Ambulatory Visit (INDEPENDENT_AMBULATORY_CARE_PROVIDER_SITE_OTHER): Payer: BC Managed Care – PPO | Admitting: Obstetrics & Gynecology

## 2019-07-09 VITALS — BP 118/75 | HR 77 | Wt 173.0 lb

## 2019-07-09 DIAGNOSIS — Z3A37 37 weeks gestation of pregnancy: Secondary | ICD-10-CM

## 2019-07-09 DIAGNOSIS — Z3689 Encounter for other specified antenatal screening: Secondary | ICD-10-CM

## 2019-07-09 DIAGNOSIS — O479 False labor, unspecified: Secondary | ICD-10-CM

## 2019-07-09 DIAGNOSIS — O0993 Supervision of high risk pregnancy, unspecified, third trimester: Secondary | ICD-10-CM

## 2019-07-09 DIAGNOSIS — O471 False labor at or after 37 completed weeks of gestation: Secondary | ICD-10-CM | POA: Diagnosis not present

## 2019-07-09 DIAGNOSIS — O0990 Supervision of high risk pregnancy, unspecified, unspecified trimester: Secondary | ICD-10-CM

## 2019-07-09 NOTE — Discharge Instructions (Signed)

## 2019-07-09 NOTE — Progress Notes (Signed)
   PRENATAL VISIT NOTE  Subjective:  Jordan Larson is a 38 y.o. [phone] at [redacted]w[redacted]d being seen today for ongoing prenatal care.  She is currently monitored for the following issues for this high-risk pregnancy and has Supervision of high risk pregnancy, antepartum; AMA (advanced maternal age) multigravida 1+; Rh negative status during pregnancy; Cervical polyp; Family history of hyperlipidemia; GAD (generalized anxiety disorder); and Vitamin D deficiency on their problem list.  Patient reports painful ctx.  .  Contractions: Irregular. Vag. Bleeding: None.  Movement: Present. Denies leaking of fluid.   The following portions of the patient's history were reviewed and updated as appropriate: allergies, current medications, past family history, past medical history, past social history, past surgical history and problem list.   Objective:   Vitals:   07/09/19 1512  BP: 118/75  Pulse: 77  Weight: 173 lb (78.5 kg)    Fetal Status: Fetal Heart Rate (bpm): 134   Movement: Present     General:  Alert, oriented and cooperative. Patient is in no acute distress.  Skin: Skin is warm and dry. No rash noted.   Cardiovascular: Normal heart rate noted  Respiratory: Normal respiratory effort, no problems with respiration noted  Abdomen: Soft, gravid, appropriate for gestational age.  Pain/Pressure: Present     Pelvic: Cervical exam performed      4-5/50/ballotable     Extremities: Normal range of motion.  Edema: None  Mental Status: Normal mood and affect. Normal behavior. Normal judgment and thought content.   Assessment and Plan:  Pregnancy: I4P3295 at [redacted]w[redacted]d  Patient has history of very rapid labors and AMA.  We will set her up for induction at 39 weeks.  Patient will come to office for cervical exams.  She has been to the MAU several times with false labor complaints.  GBS negative.  Term labor symptoms and general obstetric precautions including but not limited to vaginal bleeding,  contractions, leaking of fluid and fetal movement were reviewed in detail with the patient. Please refer to After Visit Summary for other counseling recommendations.   No follow-ups on file.  Future Appointments  Date Time Provider [place]  07/16/2019  2:45 PM Guss Bunde, MD [facility]    Silas [place], MD

## 2019-07-09 NOTE — MAU Provider Note (Signed)
S: Ms. Jordan Larson is a 38 y.o. [phone] at [redacted]w[redacted]d  who presents to MAU today for labor evaluation.     Cervical exam by RN:  Dilation: 4 Effacement (%): 70 Cervical Position: Posterior Station: -3 Presentation: Vertex Exam by:: K. WeissRN  Fetal Monitoring: Baseline: 130 Variability: moderate  Accelerations: present Decelerations: none Contractions: irregular contractions   MDM Discussed patient with RN. NST reviewed.  No cervical change.   A: SIUP at [redacted]w[redacted]d  False labor  P: Discharge home Labor precautions and kick counts included in AVS Patient to follow-up with [facility] as scheduled  Patient may return to MAU as needed or when in labor   Lajean Manes, CNM 07/09/2019 12:38 AM

## 2019-07-11 ENCOUNTER — Telehealth (HOSPITAL_COMMUNITY): Payer: Self-pay | Admitting: *Deleted

## 2019-07-11 ENCOUNTER — Encounter (HOSPITAL_COMMUNITY): Payer: Self-pay | Admitting: *Deleted

## 2019-07-11 NOTE — Telephone Encounter (Signed)
Preadmission screen  

## 2019-07-12 ENCOUNTER — Other Ambulatory Visit: Payer: Self-pay

## 2019-07-12 ENCOUNTER — Ambulatory Visit (INDEPENDENT_AMBULATORY_CARE_PROVIDER_SITE_OTHER): Payer: BC Managed Care – PPO | Admitting: Obstetrics & Gynecology

## 2019-07-12 VITALS — BP 120/73 | HR 82 | Wt 175.0 lb

## 2019-07-12 DIAGNOSIS — Z3A38 38 weeks gestation of pregnancy: Secondary | ICD-10-CM

## 2019-07-12 DIAGNOSIS — O0990 Supervision of high risk pregnancy, unspecified, unspecified trimester: Secondary | ICD-10-CM

## 2019-07-12 DIAGNOSIS — O0993 Supervision of high risk pregnancy, unspecified, third trimester: Secondary | ICD-10-CM

## 2019-07-12 NOTE — Progress Notes (Signed)
She is having no problems, has good FM, just wants a cervical check. Labor precautions given

## 2019-07-16 ENCOUNTER — Other Ambulatory Visit: Payer: Self-pay

## 2019-07-16 ENCOUNTER — Ambulatory Visit (INDEPENDENT_AMBULATORY_CARE_PROVIDER_SITE_OTHER): Payer: BC Managed Care – PPO | Admitting: Obstetrics & Gynecology

## 2019-07-16 VITALS — BP 114/72 | HR 81 | Wt 176.0 lb

## 2019-07-16 DIAGNOSIS — Z3A38 38 weeks gestation of pregnancy: Secondary | ICD-10-CM

## 2019-07-16 DIAGNOSIS — O0993 Supervision of high risk pregnancy, unspecified, third trimester: Secondary | ICD-10-CM

## 2019-07-16 DIAGNOSIS — O0990 Supervision of high risk pregnancy, unspecified, unspecified trimester: Secondary | ICD-10-CM

## 2019-07-16 NOTE — Progress Notes (Signed)
   PRENATAL VISIT NOTE  Subjective:  Jordan Larson is a 38 y.o. [phone] at [redacted]w[redacted]d being seen today for ongoing prenatal care.  She is currently monitored for the following issues for this high-risk pregnancy and has Supervision of high risk pregnancy, antepartum; AMA (advanced maternal age) multigravida 82+; Rh negative status during pregnancy; Cervical polyp; Family history of hyperlipidemia; GAD (generalized anxiety disorder); and Vitamin D deficiency on their problem list.  Patient reports contractions and pressure.  Contractions: Irregular. Vag. Bleeding: None.  Movement: Present. Denies leaking of fluid.   The following portions of the patient's history were reviewed and updated as appropriate: allergies, current medications, past family history, past medical history, past social history, past surgical history and problem list.   Objective:   Vitals:   07/16/19 1452  BP: 114/72  Pulse: 81  Weight: 176 lb (79.8 kg)    Fetal Status: Fetal Heart Rate (bpm): 142   Movement: Present     General:  Alert, oriented and cooperative. Patient is in no acute distress.  Skin: Skin is warm and dry. No rash noted.   Cardiovascular: Normal heart rate noted  Respiratory: Normal respiratory effort, no problems with respiration noted  Abdomen: Soft, gravid, appropriate for gestational age.  Pain/Pressure: Present     Pelvic: Cervical exam performed      4/50/-2  Extremities: Normal range of motion.  Edema: None  Mental Status: Normal mood and affect. Normal behavior. Normal judgment and thought content.   Assessment and Plan:  Pregnancy: G4P3003 at [redacted]w[redacted]d There are no diagnoses linked to this encounter. Term labor symptoms and general obstetric precautions including but not limited to vaginal bleeding, contractions, leaking of fluid and fetal movement were reviewed in detail with the patient. Pt has induction at 39 weeks for [facility] and history of rapid labors.    Please refer to After Visit  Summary for other counseling recommendations.   Return in about 6 weeks (around 08/27/2019) for post partum.  Future Appointments  Date Time Provider [place]  07/19/2019  7:50 AM [facility]-MAU 1 [facility] None  07/21/2019  7:00 AM [facility]-LD SCHED ROOM [facility] None    Silas [place], MD

## 2019-07-19 ENCOUNTER — Other Ambulatory Visit (HOSPITAL_COMMUNITY)
Admission: RE | Admit: 2019-07-19 | Discharge: 2019-07-19 | Disposition: A | Discharge status: Home / Self Care | Payer: BC Managed Care – PPO | Source: Ambulatory Visit | Attending: Obstetrics and Gynecology | Admitting: Obstetrics and Gynecology

## 2019-07-19 ENCOUNTER — Encounter (HOSPITAL_COMMUNITY): Payer: Self-pay

## 2019-07-19 ENCOUNTER — Other Ambulatory Visit: Payer: Self-pay

## 2019-07-19 ENCOUNTER — Inpatient Hospital Stay (HOSPITAL_COMMUNITY)
Admission: AD | Admit: 2019-07-19 | Discharge: 2019-07-19 | Disposition: A | Discharge status: Home / Self Care | Payer: BC Managed Care – PPO | Attending: Obstetrics and Gynecology | Admitting: Obstetrics and Gynecology

## 2019-07-19 DIAGNOSIS — O471 False labor at or after 37 completed weeks of gestation: Secondary | ICD-10-CM | POA: Insufficient documentation

## 2019-07-19 DIAGNOSIS — Z3A38 38 weeks gestation of pregnancy: Secondary | ICD-10-CM | POA: Insufficient documentation

## 2019-07-19 DIAGNOSIS — O479 False labor, unspecified: Secondary | ICD-10-CM

## 2019-07-19 DIAGNOSIS — Z20828 Contact with and (suspected) exposure to other viral communicable diseases: Secondary | ICD-10-CM | POA: Insufficient documentation

## 2019-07-19 DIAGNOSIS — N841 Polyp of cervix uteri: Secondary | ICD-10-CM

## 2019-07-19 DIAGNOSIS — O0990 Supervision of high risk pregnancy, unspecified, unspecified trimester: Secondary | ICD-10-CM

## 2019-07-19 LAB — SARS CORONAVIRUS 2 (TAT 6-24 HRS): SARS Coronavirus 2: NEGATIVE

## 2019-07-19 NOTE — MAU Note (Signed)
Pt reports ctx got stronger earlier this morning about q 5 min. Reports some mucusy discharge denies andy vag bleeding or leaking. Good fetal movement felt.

## 2019-07-19 NOTE — Discharge Instructions (Signed)
Braxton Hicks Contractions Contractions of the uterus can occur throughout pregnancy, but they are not always a sign that you are in labor. You may have practice contractions called Braxton Hicks contractions. These false labor contractions are sometimes confused with true labor. What are Braxton Hicks contractions? Braxton Hicks contractions are tightening movements that occur in the muscles of the uterus before labor. Unlike true labor contractions, these contractions do not result in opening (dilation) and thinning of the cervix. Toward the end of pregnancy (32-34 weeks), Braxton Hicks contractions can happen more often and may become stronger. These contractions are sometimes difficult to tell apart from true labor because they can be very uncomfortable. You should not feel embarrassed if you go to the hospital with false labor. Sometimes, the only way to tell if you are in true labor is for your health care provider to look for changes in the cervix. The health care provider will do a physical exam and may monitor your contractions. If you are not in true labor, the exam should show that your cervix is not dilating and your water has not broken. If there are no other health problems associated with your pregnancy, it is completely safe for you to be sent home with false labor. You may continue to have Braxton Hicks contractions until you go into true labor. How to tell the difference between true labor and false labor True labor  Contractions last 30-70 seconds.  Contractions become very regular.  Discomfort is usually felt in the top of the uterus, and it spreads to the lower abdomen and low back.  Contractions do not go away with walking.  Contractions usually become more intense and increase in frequency.  The cervix dilates and gets thinner. False labor  Contractions are usually shorter and not as strong as true labor contractions.  Contractions are usually irregular.  Contractions  are often felt in the front of the lower abdomen and in the groin.  Contractions may go away when you walk around or change positions while lying down.  Contractions get weaker and are shorter-lasting as time goes on.  The cervix usually does not dilate or become thin. Follow these instructions at home:   Take over-the-counter and prescription medicines only as told by your health care provider.  Keep up with your usual exercises and follow other instructions from your health care provider.  Eat and drink lightly if you think you are going into labor.  If Braxton Hicks contractions are making you uncomfortable: ? Change your position from lying down or resting to walking, or change from walking to resting. ? Sit and rest in a tub of warm water. ? Drink enough fluid to keep your urine pale yellow. Dehydration may cause these contractions. ? Do slow and deep breathing several times an hour.  Keep all follow-up prenatal visits as told by your health care provider. This is important. Contact a health care provider if:  You have a fever.  You have continuous pain in your abdomen. Get help right away if:  Your contractions become stronger, more regular, and closer together.  You have fluid leaking or gushing from your vagina.  You pass blood-tinged mucus (bloody show).  You have bleeding from your vagina.  You have low back pain that you never had before.  You feel your baby's head pushing down and causing pelvic pressure.  Your baby is not moving inside you as much as it used to. Summary  Contractions that occur before labor are   called Braxton Hicks contractions, false labor, or practice contractions.  Braxton Hicks contractions are usually shorter, weaker, farther apart, and less regular than true labor contractions. True labor contractions usually become progressively stronger and regular, and they become more frequent.  Manage discomfort from Braxton Hicks contractions  by changing position, resting in a warm bath, drinking plenty of water, or practicing deep breathing. This information is not intended to replace advice given to you by your health care provider. Make sure you discuss any questions you have with your health care provider. Document Released: 04/14/2017 Document Revised: 11/11/2017 Document Reviewed: 04/14/2017 Elsevier Patient Education  2020 [employer]  

## 2019-07-19 NOTE — MAU Note (Signed)
Swab collected without difficulty. 

## 2019-07-21 ENCOUNTER — Inpatient Hospital Stay (HOSPITAL_COMMUNITY)
Admission: AD | Admit: 2019-07-21 | Discharge: 2019-07-22 | DRG: 807 | Disposition: A | Discharge status: Home / Self Care | Payer: BC Managed Care – PPO | Attending: Family Medicine | Admitting: Family Medicine

## 2019-07-21 ENCOUNTER — Inpatient Hospital Stay (HOSPITAL_COMMUNITY): Admission: AD | Admit: 2019-07-21 | Payer: BC Managed Care – PPO | Source: Home / Self Care | Admitting: Family Medicine

## 2019-07-21 ENCOUNTER — Encounter (HOSPITAL_COMMUNITY): Payer: Self-pay | Admitting: *Deleted

## 2019-07-21 ENCOUNTER — Inpatient Hospital Stay (HOSPITAL_COMMUNITY): Payer: BC Managed Care – PPO | Admitting: Anesthesiology

## 2019-07-21 ENCOUNTER — Other Ambulatory Visit: Payer: Self-pay

## 2019-07-21 ENCOUNTER — Inpatient Hospital Stay (HOSPITAL_COMMUNITY): Payer: BC Managed Care – PPO

## 2019-07-21 DIAGNOSIS — O3443 Maternal care for other abnormalities of cervix, third trimester: Secondary | ICD-10-CM | POA: Diagnosis present

## 2019-07-21 DIAGNOSIS — Z3A38 38 weeks gestation of pregnancy: Secondary | ICD-10-CM

## 2019-07-21 DIAGNOSIS — O26899 Other specified pregnancy related conditions, unspecified trimester: Secondary | ICD-10-CM

## 2019-07-21 DIAGNOSIS — O26893 Other specified pregnancy related conditions, third trimester: Principal | ICD-10-CM | POA: Diagnosis present

## 2019-07-21 DIAGNOSIS — O0990 Supervision of high risk pregnancy, unspecified, unspecified trimester: Secondary | ICD-10-CM

## 2019-07-21 DIAGNOSIS — F411 Generalized anxiety disorder: Secondary | ICD-10-CM | POA: Diagnosis present

## 2019-07-21 DIAGNOSIS — O09529 Supervision of elderly multigravida, unspecified trimester: Secondary | ICD-10-CM

## 2019-07-21 DIAGNOSIS — O99344 Other mental disorders complicating childbirth: Secondary | ICD-10-CM | POA: Diagnosis present

## 2019-07-21 DIAGNOSIS — N841 Polyp of cervix uteri: Secondary | ICD-10-CM | POA: Diagnosis present

## 2019-07-21 LAB — CBC
HCT: 35.8 % — ABNORMAL LOW (ref 36.0–46.0)
Hemoglobin: 11.3 g/dL — ABNORMAL LOW (ref 12.0–15.0)
MCH: 27.6 pg (ref 26.0–34.0)
MCHC: 31.6 g/dL (ref 30.0–36.0)
MCV: 87.3 fL (ref 80.0–100.0)
Platelets: 227 10*3/uL (ref 150–400)
RBC: 4.1 MIL/uL (ref 3.87–5.11)
RDW: 14.5 % (ref 11.5–15.5)
WBC: 12 10*3/uL — ABNORMAL HIGH (ref 4.0–10.5)
nRBC: 0 % (ref 0.0–0.2)

## 2019-07-21 MED ORDER — OXYTOCIN BOLUS FROM INFUSION
500.0000 mL | Freq: Once | INTRAVENOUS | Status: AC
  Administered 2019-07-21: 500 mL via INTRAVENOUS

## 2019-07-21 MED ORDER — PHENYLEPHRINE 40 MCG/ML (10ML) SYRINGE FOR IV PUSH (FOR BLOOD PRESSURE SUPPORT)
80.0000 ug | PREFILLED_SYRINGE | INTRAVENOUS | Status: DC | PRN
  Filled 2019-07-21: qty 10

## 2019-07-21 MED ORDER — PRENATAL MULTIVITAMIN CH
1.0000 | ORAL_TABLET | Freq: Every day | ORAL | Status: DC
  Administered 2019-07-22: 12:00:00 1 via ORAL
  Filled 2019-07-21: qty 1

## 2019-07-21 MED ORDER — LACTATED RINGERS IV SOLN: 500.0000 mL | Freq: Once | INTRAVENOUS | Status: DC

## 2019-07-21 MED ORDER — DIPHENHYDRAMINE HCL 25 MG PO CAPS: 25.0000 mg | ORAL_CAPSULE | Freq: Four times a day (QID) | ORAL | Status: DC | PRN

## 2019-07-21 MED ORDER — PHENYLEPHRINE 40 MCG/ML (10ML) SYRINGE FOR IV PUSH (FOR BLOOD PRESSURE SUPPORT): 80.0000 ug | PREFILLED_SYRINGE | INTRAVENOUS | Status: DC | PRN

## 2019-07-21 MED ORDER — LACTATED RINGERS IV SOLN: 500.0000 mL | INTRAVENOUS | Status: DC | PRN

## 2019-07-21 MED ORDER — MAGNESIUM HYDROXIDE 400 MG/5ML PO SUSP: 30.0000 mL | ORAL | Status: DC | PRN

## 2019-07-21 MED ORDER — LIDOCAINE HCL (PF) 1 % IJ SOLN
INTRAMUSCULAR | Status: DC | PRN
  Administered 2019-07-21: 6 mL via EPIDURAL

## 2019-07-21 MED ORDER — BENZOCAINE-MENTHOL 20-0.5 % EX AERO: 1.0000 "application " | INHALATION_SPRAY | CUTANEOUS | Status: DC | PRN

## 2019-07-21 MED ORDER — FENTANYL-BUPIVACAINE-NACL 0.5-0.125-0.9 MG/250ML-% EP SOLN
12.0000 mL/h | EPIDURAL | Status: DC | PRN
  Filled 2019-07-21: qty 250

## 2019-07-21 MED ORDER — IBUPROFEN 600 MG PO TABS
600.0000 mg | ORAL_TABLET | Freq: Four times a day (QID) | ORAL | Status: DC
  Administered 2019-07-22 (×2): 600 mg via ORAL
  Filled 2019-07-21 (×4): qty 1

## 2019-07-21 MED ORDER — EPHEDRINE 5 MG/ML INJ: 10.0000 mg | INTRAVENOUS | Status: DC | PRN

## 2019-07-21 MED ORDER — OXYCODONE-ACETAMINOPHEN 5-325 MG PO TABS: 1.0000 | ORAL_TABLET | ORAL | Status: DC | PRN

## 2019-07-21 MED ORDER — ACETAMINOPHEN 325 MG PO TABS: 650.0000 mg | ORAL_TABLET | ORAL | Status: DC | PRN

## 2019-07-21 MED ORDER — WITCH HAZEL-GLYCERIN EX PADS: 1.0000 "application " | MEDICATED_PAD | CUTANEOUS | Status: DC | PRN

## 2019-07-21 MED ORDER — ERYTHROMYCIN 5 MG/GM OP OINT
TOPICAL_OINTMENT | OPHTHALMIC | Status: AC
  Filled 2019-07-21: qty 1

## 2019-07-21 MED ORDER — DIBUCAINE (PERIANAL) 1 % EX OINT: 1.0000 "application " | TOPICAL_OINTMENT | CUTANEOUS | Status: DC | PRN

## 2019-07-21 MED ORDER — ONDANSETRON HCL 4 MG PO TABS: 4.0000 mg | ORAL_TABLET | ORAL | Status: DC | PRN

## 2019-07-21 MED ORDER — ONDANSETRON HCL 4 MG/2ML IJ SOLN: 4.0000 mg | INTRAMUSCULAR | Status: DC | PRN

## 2019-07-21 MED ORDER — FERROUS SULFATE 325 (65 FE) MG PO TABS
325.0000 mg | ORAL_TABLET | Freq: Two times a day (BID) | ORAL | Status: DC
  Administered 2019-07-22 (×2): 325 mg via ORAL
  Filled 2019-07-21 (×2): qty 1

## 2019-07-21 MED ORDER — TERBUTALINE SULFATE 1 MG/ML IJ SOLN: 0.2500 mg | Freq: Once | INTRAMUSCULAR | Status: DC | PRN

## 2019-07-21 MED ORDER — SIMETHICONE 80 MG PO CHEW: 80.0000 mg | CHEWABLE_TABLET | ORAL | Status: DC | PRN

## 2019-07-21 MED ORDER — SODIUM CHLORIDE (PF) 0.9 % IJ SOLN
INTRAMUSCULAR | Status: DC | PRN
  Administered 2019-07-21: 12 mL/h via EPIDURAL

## 2019-07-21 MED ORDER — IBUPROFEN 600 MG PO TABS: 600.0000 mg | ORAL_TABLET | Freq: Once | ORAL | Status: DC

## 2019-07-21 MED ORDER — DIPHENHYDRAMINE HCL 50 MG/ML IJ SOLN: 12.5000 mg | INTRAMUSCULAR | Status: DC | PRN

## 2019-07-21 MED ORDER — SOD CITRATE-CITRIC ACID 500-334 MG/5ML PO SOLN: 30.0000 mL | ORAL | Status: DC | PRN

## 2019-07-21 MED ORDER — COCONUT OIL OIL: 1.0000 "application " | TOPICAL_OIL | Status: DC | PRN

## 2019-07-21 MED ORDER — FLEET ENEMA 7-19 GM/118ML RE ENEM: 1.0000 | ENEMA | RECTAL | Status: DC | PRN

## 2019-07-21 MED ORDER — LIDOCAINE HCL (PF) 1 % IJ SOLN
30.0000 mL | INTRAMUSCULAR | Status: AC | PRN
  Administered 2019-07-21: 30 mL via SUBCUTANEOUS
  Filled 2019-07-21: qty 30

## 2019-07-21 MED ORDER — OXYCODONE-ACETAMINOPHEN 5-325 MG PO TABS: 2.0000 | ORAL_TABLET | ORAL | Status: DC | PRN

## 2019-07-21 MED ORDER — OXYTOCIN 40 UNITS IN NORMAL SALINE INFUSION - SIMPLE MED
2.5000 [IU]/h | INTRAVENOUS | Status: DC
  Administered 2019-07-21: 15:00:00 2.5 [IU]/h via INTRAVENOUS

## 2019-07-21 MED ORDER — LACTATED RINGERS IV SOLN: INTRAVENOUS | Status: DC

## 2019-07-21 MED ORDER — OXYTOCIN 40 UNITS IN NORMAL SALINE INFUSION - SIMPLE MED
1.0000 m[IU]/min | INTRAVENOUS | Status: DC
  Administered 2019-07-21: 2 m[IU]/min via INTRAVENOUS
  Filled 2019-07-21: qty 1000

## 2019-07-21 MED ORDER — ONDANSETRON HCL 4 MG/2ML IJ SOLN: 4.0000 mg | Freq: Four times a day (QID) | INTRAMUSCULAR | Status: DC | PRN

## 2019-07-21 NOTE — Anesthesia Preprocedure Evaluation (Signed)
Anesthesia Evaluation  Patient identified by MRN, date of birth, ID band Patient awake    Reviewed: Allergy & Precautions, H&P , NPO status , Patient's Chart, lab work & pertinent test results, reviewed documented beta blocker date and time   Airway Mallampati: II  TM Distance: >3 FB Neck ROM: full    Dental no notable dental hx.    Pulmonary neg pulmonary ROS,    Pulmonary exam normal breath sounds clear to auscultation       Cardiovascular negative cardio ROS Normal cardiovascular exam Rhythm:regular Rate:Normal  Vasovagal syncope   Neuro/Psych PSYCHIATRIC DISORDERS Anxiety negative neurological ROS     GI/Hepatic negative GI ROS, Neg liver ROS,   Endo/Other  negative endocrine ROS  Renal/GU negative Renal ROS  negative genitourinary   Musculoskeletal   Abdominal   Peds  Hematology negative hematology ROS (+)   Anesthesia Other Findings   Reproductive/Obstetrics (+) Pregnancy                             Anesthesia Physical Anesthesia Plan  ASA: II  Anesthesia Plan: Epidural   Post-op Pain Management:    Induction:   PONV Risk Score and Plan:   Airway Management Planned:   Additional Equipment:   Intra-op Plan:   Post-operative Plan:   Informed Consent: I have reviewed the patients History and Physical, chart, labs and discussed the procedure including the risks, benefits and alternatives for the proposed anesthesia with the patient or authorized representative who has indicated his/her understanding and acceptance.       Plan Discussed with:   Anesthesia Plan Comments:         Anesthesia Quick Evaluation

## 2019-07-21 NOTE — Anesthesia Postprocedure Evaluation (Signed)
Anesthesia Post Note  Patient: Jordan Larson  Procedure(s) Performed: AN AD HOC LABOR EPIDURAL     Patient location during evaluation: Mother Baby Anesthesia Type: Epidural Level of consciousness: awake and alert Pain management: pain level controlled Vital Signs Assessment: post-procedure vital signs reviewed and stable Respiratory status: spontaneous breathing, nonlabored ventilation and respiratory function stable Cardiovascular status: stable Postop Assessment: no headache, no backache and epidural receding Anesthetic complications: no    Last Vitals:  Vitals:   07/21/19 1650 07/21/19 1749  BP: 121/80 116/72  Pulse: 64 68  Resp: 16 18  Temp: 36.7 C 36.7 C  SpO2:      Last Pain:  Vitals:   07/21/19 1750  TempSrc:   PainSc: 0-No pain   Pain Goal:                   Hazell Siwik

## 2019-07-21 NOTE — Discharge Summary (Signed)
Obstetrics Discharge Summary OB/[facility]N Faculty [facility]   Patient Name: Jordan Larson DOB: [birth year] MRN: [id number]  Date of admission: 07/21/2019 Delivering MD: Calvert CantorWEINHOLD, SAMANTHA C   Date of discharge: 07/22/2019  Admitting diagnosis: pregnancy Intrauterine pregnancy: 1048w6d     Secondary diagnosis:   Active Problems:   AMA (advanced maternal age) multigravida 35+   Rh negative status during pregnancy   Cervical polyp   GAD (generalized anxiety disorder)   Additional problems:  . N/A     Discharge diagnosis: Term Pregnancy Delivered                                            Postpartum procedures: None (Rhogam not indicated as baby A-)  Complications: none  Outpatient Follow-Up: --Patient does not desire contraception --Candidate for virtual postpartum visit in 4-6 weeks  Hospital course: Jordan Larson is a 38 y.o. 6748w6d who was admitted for IOL for AMA. Her pregnancy was complicated by the following: Supervision of high risk pregnancy, antepartum; AMA (advanced maternal age) multigravida 35+; Rh negative status during pregnancy; Cervical polyp; Family history of hyperlipidemia; GAD (generalized anxiety disorder); and Vitamin D deficiency on their problem list. er labor course was uncomplicated. Delivery was uncomplicated. Please see delivery/op note for additional details. Her postpartum course was uncomplicated. She was breastfeeding without difficulty. By day of discharge, she was passing flatus, urinating, eating and drinking without difficulty. Her pain was well-controlled, and she was discharged home with ibuprofen/tylenol. She will follow-up in clinic in 4-6 weeks.   Physical exam  Vitals:   07/21/19 1749 07/21/19 2150 07/22/19 0150 07/22/19 0550  BP: 116/72 (!) 97/59 108/67 106/75  Pulse: 68 67 64 65  Resp: 18 17 18 17   Temp: 98.1 F (36.7 C) 98.4 F (36.9 C) 97.8 F (36.6 C) 98 F (36.7 C)  TempSrc: Oral Oral Oral Oral  SpO2:      Weight:      Height:        General: well appearing, NAD Lochia: appropriate Uterine Fundus: firm Incision: N/A DVT Evaluation: No evidence of DVT seen on physical exam. Labs: Lab Results  Component Value Date   WBC 12.0 (H) 07/21/2019   HGB 11.3 (L) 07/21/2019   HCT 35.8 (L) 07/21/2019   MCV 87.3 07/21/2019   PLT 227 07/21/2019   CMP Latest Ref Rng & Units 01/15/2019  Glucose 65 - 99 mg/dL 40(J64(L)  BUN 7 - 25 mg/dL 4(L)  Creatinine 8.110.50 - 1.10 mg/dL 9.140.50  Sodium 782135 - 956146 mmol/L 135  Potassium 3.5 - 5.3 mmol/L 3.1(L)  Chloride 98 - 110 mmol/L 101  CO2 20 - 32 mmol/L 28  Calcium 8.6 - 10.2 mg/dL 8.8  Total Protein 6.1 - 8.1 g/dL 6.1  Total Bilirubin 0.2 - 1.2 mg/dL 0.4  AST 10 - 30 U/L 15  ALT 6 - 29 U/L 13    Discharge instructions: Per After Visit Summary and "Baby and Me Booklet"  After visit meds:  Allergies as of 07/22/2019   No Known Allergies     Medication List    TAKE these medications   acetaminophen 325 MG tablet Commonly known as: Tylenol Take 2 tablets (650 mg total) by mouth every 4 (four) hours as needed (for pain scale < 4).   bifidobacterium infantis capsule Take 1 capsule by mouth at bedtime.   calcium carbonate 500 MG chewable tablet  Commonly known as: TUMS - dosed in mg elemental calcium Chew 2 tablets by mouth daily as needed for indigestion or heartburn.   cetirizine 10 MG tablet Commonly known as: ZYRTEC Take 10 mg by mouth at bedtime.   ferrous sulfate 325 (65 FE) MG tablet Take 1 tablet (325 mg total) by mouth 2 (two) times daily with a meal.   fluticasone 50 MCG/ACT nasal spray Commonly known as: FLONASE Place into both nostrils daily.   ibuprofen 600 MG tablet Commonly known as: ADVIL Take 1 tablet (600 mg total) by mouth every 6 (six) hours.   prenatal multivitamin Tabs tablet Take 1 tablet by mouth at bedtime.   senna-docusate 8.6-50 MG tablet Commonly known as: Senokot-S Take 2 tablets by mouth daily.   ZOLOFT PO Take by mouth.        Postpartum contraception: Condoms Diet: Routine Diet Activity: Advance as tolerated. Pelvic rest for 6 weeks.   Follow-up Appt: Future Appointments  Date Time Provider [place]  09/03/2019  2:00 PM Emily Filbert, MD [facility]   Follow-up Visit:No follow-ups on file.  Newborn Data: Live born female  Birth Weight: 8 lb 0.9 oz (3654 g) APGAR: 9, 10  Newborn Delivery   Birth date/time: 07/21/2019 14:41:00 Delivery type: Vaginal, Spontaneous      Baby Feeding: Breast Disposition:home with mother   Corliss Blacker, PGY-III Family Medicine  OB FELLOW DISCHARGE ATTESTATION  I have seen and examined this patient and agree with above documentation in the resident's note.   Phill Myron, D.O. OB Fellow  07/22/2019, 8:58 AM

## 2019-07-21 NOTE — Progress Notes (Signed)
Jordan Larson is a 38 y.o. G4P3003 at [redacted]w[redacted]d   Subjective: S/p epidural, comfortable and feeling excited.  Objective: BP 105/64   Pulse 76   Temp 97.6 F (36.4 C) (Axillary)   Resp 16   Ht 5\' 8"  (1.727 m)   Wt 82.1 kg   LMP 10/22/2018   SpO2 99%   BMI 27.52 kg/m  No intake/output data recorded. No intake/output data recorded.  FHT:  FHR: 125 bpm, variability: moderate,  accelerations:  Present,  decelerations:  Absent UC:   regular, every 2-3 minutes SVE:   Dilation: 5 Effacement (%): 80 Station: 0 Exam by:: lee  Labs: Lab Results  Component Value Date   WBC 12.0 (H) 07/21/2019   HGB 11.3 (L) 07/21/2019   HCT 35.8 (L) 07/21/2019   MCV 87.3 07/21/2019   PLT 227 07/21/2019    Assessment / Plan: 38 y.o. G4P3003 at [redacted]w[redacted]d  Category I tracing AROM now, large amount clear fluid Continue Pitocin titration PRN, currently infusing at 8 miiliunits Anticipated MOD:  NSVD  Darlina Rumpf, CNM 07/21/2019, 12:15 PM

## 2019-07-21 NOTE — Anesthesia Procedure Notes (Signed)
Epidural Patient location during procedure: OB Start time: 07/21/2019 9:44 AM End time: 07/21/2019 9:50 AM  Staffing Anesthesiologist: Janeece Riggers, MD  Preanesthetic Checklist Completed: patient identified, site marked, surgical consent, pre-op evaluation, timeout performed, IV checked, risks and benefits discussed and monitors and equipment checked  Epidural Patient position: sitting Prep: site prepped and draped and DuraPrep Patient monitoring: continuous pulse ox and blood pressure Approach: midline Location: L3-L4 Injection technique: LOR air  Needle:  Needle type: Tuohy  Needle gauge: 17 G Needle length: 9 cm and 9 Needle insertion depth: 6 cm Catheter type: closed end flexible Catheter size: 19 Gauge Catheter at skin depth: 11 cm Test dose: negative  Assessment Events: blood not aspirated, injection not painful, no injection resistance, negative IV test and no paresthesia

## 2019-07-21 NOTE — Discharge Instructions (Signed)

## 2019-07-21 NOTE — H&P (Signed)
Jordan Larson is a 38 y.o. female presenting for IOL for AMA and history of rapid deliveries. She requests Pitocin, epidural, then AROM. She reports that this has been the formula for her previous deliveries and she typically delivers within thirty minutes of AROM. Upon admission she denies vaginal bleeding, leaking of fluid, decreased fetal movement, fever, falls, or recent illness.   Prenatal Care - [place] - Dating by LMP c/q early [place] - Low risk MIPS, AFP - Normal scan, no referrals - Rubella immune - A Neg, Rhogam 05/09/19 - Flu 10/19 - TDAP 05/06/19  OB History    Gravida  4   Para  3   Term  3   Preterm      AB      Living  3     SAB      TAB      Ectopic      Multiple  0   Live Births  3          Past Medical History:  Diagnosis Date  . C. difficile enteritis 2011   Sensitive to antibiotics; does well on Amoxicillin  . Cervical polyp   . Labor, precipitous, delivered   . Medical history non-contributory   . Vasovagal syncope    Past Surgical History:  Procedure Laterality Date  . COLONOSCOPY    . WISDOM TOOTH EXTRACTION     Family History: family history includes Breast cancer (age of onset: 83) in her maternal grandmother; COPD in her paternal grandmother; Cancer in her maternal grandfather; Diabetes in her father; Hypertension in her paternal grandmother; Skin cancer in her mother; Vision loss in her maternal grandmother. Social History:  reports that she has never smoked. She has never used smokeless tobacco. She reports that she does not drink alcohol or use drugs.    Maternal Diabetes: No Genetic Screening: Normal Maternal Ultrasounds/Referrals: Normal Fetal Ultrasounds or other Referrals:  None Maternal Substance Abuse:  No Significant Maternal Medications:  None Significant Maternal Lab Results:  Group B Strep negative Other Comments:  None  Review of Systems  Constitutional: Negative for fever.  Respiratory: Negative for shortness  of breath.   Gastrointestinal: Positive for abdominal pain.       Intermittent mild contractions, denies pain  Musculoskeletal: Negative for back pain.  Neurological: Negative for headaches.  All other systems reviewed and are negative.  History   Height 5\' 8"  (1.727 m), weight 82.1 kg, last menstrual period 10/22/2018, currently breastfeeding. Maternal Exam:  Introitus: Vagina is negative for discharge.    Physical Exam  Nursing note and vitals reviewed. Constitutional: She is oriented to person, place, and time. She appears well-developed and well-nourished.  Cardiovascular: Normal rate.  Respiratory: Effort normal. No respiratory distress.  GI: She exhibits no distension. There is no abdominal tenderness. There is no rebound and no guarding.  Gravid  Genitourinary:    Vagina and uterus normal.     No vaginal discharge.   Neurological: She is alert and oriented to person, place, and time.  Skin: Skin is warm and dry.  Psychiatric: She has a normal mood and affect. Her behavior is normal. Judgment and thought content normal.    Prenatal labs: ABO, Rh: A/RH(D) NEGATIVE/-- (02/03 1050) Antibody: NO ANTIBODIES DETECTED (05/27 0856) Rubella: 4.76 (02/03 1050) RPR: NON-REACTIVE (05/27 0856)  HBsAg: NON-REACTIVE (02/03 1050)  HIV: NON-REACTIVE (05/27 0856)  GBS:   Negative  Fetal Surveillance - Category I tracing: baseline 135, mod var, pos accels, no  decels - Toco: irregular mild ctx q 2-5 min  Assessment/Plan: --38 y.o. G4P3003 at 3862w6d IOL for AMA and history of rapid second stage --Category I tracing --GBS negative --Consistent with patient request, appropriate for Pitocin. Low dose until comfortable with epidural then titrate 2 x 2.  - May have epidural on request --boy(inpatient circ)/breast/declines contraception  Calvert CantorSamantha C Weinhold, CNM 07/21/2019, 8:05 AM

## 2019-07-22 ENCOUNTER — Encounter (HOSPITAL_COMMUNITY): Payer: Self-pay

## 2019-07-22 LAB — RPR: RPR Ser Ql: NONREACTIVE

## 2019-07-22 MED ORDER — IBUPROFEN 600 MG PO TABS: 600.0000 mg | ORAL_TABLET | Freq: Four times a day (QID) | ORAL | 1 refills | Status: DC

## 2019-07-22 MED ORDER — FERROUS SULFATE 325 (65 FE) MG PO TABS: 325.0000 mg | ORAL_TABLET | Freq: Two times a day (BID) | ORAL | 3 refills | Status: DC

## 2019-07-22 NOTE — Lactation Note (Signed)
This note was copied from a baby's chart. Lactation Consultation Note  Patient Name: Boy Katey Barrie [id number] Date: 07/22/2019 Reason for consult: Initial assessment;Early term 37-38.6wks P4, 53 hour female infant, ETI. Mom is an experience at breastfeeding she breastfeed her 1st child for 9 months and her 2nd and 3rd child for one year.  Per mom she feels breastfeeding is going well no concerns at this time. Infant had 4 voids since birth. Mom has Spectra 2 DEBP at home. Per mom, she breastfeed infant 4 x since birth and infant is feeding 10-15 minutes per feeding. Mom breastfeed infant at 12:30 am, LC did not observe latch at this time. Mom knows to breastfeed infant according hunger cues, 8 to 12 times within 24 hours and on demand. Reviewed Baby & Me book's Breastfeeding Basics.  Mom made aware of O/P services, breastfeeding support groups, community resources, and our phone # for post-discharge questions.    Maternal Data Formula Feeding for Exclusion: No Has patient been taught Hand Expression?: Yes Does the patient have breastfeeding experience prior to this delivery?: Yes  Feeding    LATCH Score                   Interventions Interventions: Breast feeding basics reviewed;Skin to skin  Lactation Tools Discussed/Used WIC Program: No   Consult Status Consult Status: Follow-up Date: 07/22/19 Follow-up type: In-patient    Vicente Serene 07/22/2019, 1:59 AM

## 2019-07-22 NOTE — Progress Notes (Signed)
Attending Circumcision Counseling Progress Note  Patient desires circumcision for her female infant.  Circumcision procedure details discussed, risks and benefits of procedure were also discussed.  These include but are not limited to: Benefits of circumcision in men include reduction in the rates of urinary tract infection (UTI), penile cancer, some sexually transmitted infections, penile inflammatory and retractile disorders, as well as easier hygiene.  Risks include bleeding , infection, injury of glans which may lead to penile deformity or urinary tract issues, unsatisfactory cosmetic appearance and other potential complications related to the procedure.  It was emphasized that this is an elective procedure.  Patient wants to proceed with circumcision; written informed consent obtained.  Will do circumcision soon, routine circumcision and post circumcision care ordered for the infant.  Willona Phariss L. Rip Harbour, M.D. 07/22/2019 4:26 PM

## 2019-07-22 NOTE — Progress Notes (Signed)
Pt declined scheduled ibuprofen @ 0013 and @0550  on 07/19/2019. Pt verbalized "I already took one at 1am that I brought from home". Pt educated about medication-from-home policy. Encouraged pt to call RN for pain medication.

## 2019-07-23 LAB — BPAM RBC
Blood Product Expiration Date: 202008272359
Blood Product Expiration Date: 202008282359
Unit Type and Rh: 600
Unit Type and Rh: 600

## 2019-07-23 LAB — TYPE AND SCREEN
ABO/RH(D): A NEG
Antibody Screen: POSITIVE
Unit division: 0
Unit division: 0

## 2019-07-23 NOTE — Progress Notes (Signed)
   PRENATAL VISIT NOTE  Subjective:  Jordan Larson is a 38 y.o. G4P4004 at [redacted]w[redacted]d being seen today for ongoing prenatal care.  She is currently monitored for the following issues for this high-risk pregnancy and has Supervision of high risk pregnancy, antepartum; AMA (advanced maternal age) multigravida 70+; Rh negative status during pregnancy; Cervical polyp; Family history of hyperlipidemia; GAD (generalized anxiety disorder); and Vitamin D deficiency on their problem list.  Patient reports no complaints.  Contractions: Irregular. Vag. Bleeding: None.  Movement: Present. Denies leaking of fluid.   The following portions of the patient's history were reviewed and updated as appropriate: allergies, current medications, past family history, past medical history, past social history, past surgical history and problem list.   Objective:   Vitals:   07/12/19 1433  BP: 120/73  Pulse: 82  Weight: 175 lb (79.4 kg)    Fetal Status: Fetal Heart Rate (bpm): 134   Movement: Present  Presentation: Vertex  General:  Alert, oriented and cooperative. Patient is in no acute distress.  Skin: Skin is warm and dry. No rash noted.   Cardiovascular: Normal heart rate noted  Respiratory: Normal respiratory effort, no problems with respiration noted  Abdomen: Soft, gravid, appropriate for gestational age.  Pain/Pressure: Present     Pelvic: Cervical exam performed Dilation: 4 Effacement (%): 50 Station: -3  Extremities: Normal range of motion.  Edema: None  Mental Status: Normal mood and affect. Normal behavior. Normal judgment and thought content.   Assessment and Plan:  Pregnancy: [id number] at [redacted]w[redacted]d 1. Supervision of high risk pregnancy, antepartum   Preterm labor symptoms and general obstetric precautions including but not limited to vaginal bleeding, contractions, leaking of fluid and fetal movement were reviewed in detail with the patient. Please refer to After Visit Summary for other counseling  recommendations.   No follow-ups on file.  Future Appointments  Date Time Provider [place]  09/03/2019  2:00 PM Emily Filbert, MD [facility]    Emily Filbert, MD

## 2019-09-03 ENCOUNTER — Other Ambulatory Visit: Payer: Self-pay

## 2019-09-03 ENCOUNTER — Telehealth (INDEPENDENT_AMBULATORY_CARE_PROVIDER_SITE_OTHER): Payer: BC Managed Care – PPO | Admitting: Obstetrics & Gynecology

## 2019-09-03 NOTE — Progress Notes (Signed)
TELEHEALTH POSTPARTUM VIRTUAL VIDEO VISIT ENCOUNTER NOTE   Provider location: Center for [facility] at [place]   I connected with Jordan Larson on 09/03/19 at  9:30 AM EDT by MyChart Video Encounter at home and verified that I am speaking with the correct person using two identifiers.    I discussed the limitations, risks, security and privacy concerns of performing an evaluation and management service virtually and the availability of in person appointments. I also discussed with the patient that there may be a patient responsible charge related to this service. The patient expressed understanding and agreed to proceed.  Chief Complaint: Postpartum Visit  History of Present Illness: Jordan Larson is a 38 y.o. Caucasian [id number] being evaluated for postpartum followup.    She is s/p normal spontaneous vaginal delivery at 39 weeks. Baby is doing well  Complains of none.  Vaginal bleeding or discharge: Yes  Intercourse: No  Contraception: condoms Mode of feeding infant: Breast PP depression s/s: No . She is on generic zoloft.  Any bowel or bladder issues: No  Pap smear: no abnormalities on 1/20.   Review of Systems:  Her 12 point review of systems is negative or as noted in the History of Present Illness.  Patient Active Problem List   Diagnosis Date Noted  . Cervical polyp 01/01/2019  . Family history of hyperlipidemia 11/30/2018  . Vitamin D deficiency 01/12/2018  . GAD (generalized anxiety disorder) 08/22/2017  . Supervision of high risk pregnancy, antepartum 03/12/2016  . AMA (advanced maternal age) multigravida 35+ 03/12/2016  . Rh negative status during pregnancy 03/12/2016    Medications Jordan Larson had no medications administered during this visit. Current Outpatient Medications  Medication Sig Dispense Refill  . acetaminophen (TYLENOL) 325 MG tablet Take 2 tablets (650 mg total) by mouth every 4 (four) hours as needed (for pain scale <  4). 60 tablet 0  . bifidobacterium infantis (ALIGN) capsule Take 1 capsule by mouth at bedtime.    . calcium carbonate (TUMS - DOSED IN MG ELEMENTAL CALCIUM) 500 MG chewable tablet Chew 2 tablets by mouth daily as needed for indigestion or heartburn.    . cetirizine (ZYRTEC) 10 MG tablet Take 10 mg by mouth at bedtime.     . ferrous sulfate 325 (65 FE) MG tablet Take 1 tablet (325 mg total) by mouth 2 (two) times daily with a meal. 60 tablet 3  . fluticasone (FLONASE) 50 MCG/ACT nasal spray Place into both nostrils daily.    Marland Kitchen ibuprofen (ADVIL) 600 MG tablet Take 1 tablet (600 mg total) by mouth every 6 (six) hours. 30 tablet 1  . Prenatal Vit-Fe Fumarate-FA (PRENATAL MULTIVITAMIN) TABS tablet Take 1 tablet by mouth at bedtime.    . senna-docusate (SENOKOT-S) 8.6-50 MG tablet Take 2 tablets by mouth daily. 60 tablet 1  . Sertraline HCl (ZOLOFT PO) Take by mouth.     No current facility-administered medications for this visit.     Allergies Patient has no known allergies.  Physical Exam:  LMP 10/22/2018  Reviewed from Babyscripts General:  Alert, oriented and cooperative. Patient is in no acute distress.  Mental Status: Normal mood and affect. Normal behavior. Normal judgment and thought content.   Respiratory: Normal respiratory effort noted, no problems with respiration noted  Rest of physical exam deferred due to type of encounter  PP Depression Screening:   Edinburgh Postnatal Depression Scale Screening Tool 09/03/2019 07/21/2019  I have been able to laugh and see the  funny side of things. 0 0  I have looked forward with enjoyment to things. 0 0  I have blamed myself unnecessarily when things went wrong. 0 0  I have been anxious or worried for no good reason. 0 0  I have felt scared or panicky for no good reason. 0 0  Things have been getting on top of me. 0 0  I have been so unhappy that I have had difficulty sleeping. 0 0  I have felt sad or miserable. 0 0  I have been so unhappy  that I have been crying. 0 0  The thought of harming myself has occurred to me. 0 0  Edinburgh Postnatal Depression Scale Total 0 0     Assessment:  Normal pp exam  Plan: I explained that she does not a pap smear for 3 years. She wants to return to [facility] when she quits breastfeeding. I told her that she can call for a prescription prn.     I discussed the assessment and treatment plan with the patient. The patient was provided an opportunity to ask questions and all were answered. The patient agreed with the plan and demonstrated an understanding of the instructions.   The patient was advised to call back or seek an in-person evaluation/go to the ED for any concerning postpartum symptoms.  I provided 10 minutes of face-to-face time during this encounter.   Allie Bossier, MD Center for [facility], [facility]

## 2019-10-15 DIAGNOSIS — M5386 Other specified dorsopathies, lumbar region: Secondary | ICD-10-CM | POA: Diagnosis not present

## 2019-10-15 DIAGNOSIS — M9902 Segmental and somatic dysfunction of thoracic region: Secondary | ICD-10-CM | POA: Diagnosis not present

## 2019-10-15 DIAGNOSIS — M9903 Segmental and somatic dysfunction of lumbar region: Secondary | ICD-10-CM | POA: Diagnosis not present

## 2019-10-15 DIAGNOSIS — M9904 Segmental and somatic dysfunction of sacral region: Secondary | ICD-10-CM | POA: Diagnosis not present

## 2019-11-12 DIAGNOSIS — H6122 Impacted cerumen, left ear: Secondary | ICD-10-CM | POA: Diagnosis not present

## 2019-11-12 DIAGNOSIS — F411 Generalized anxiety disorder: Secondary | ICD-10-CM | POA: Diagnosis not present

## 2019-12-03 DIAGNOSIS — H6982 Other specified disorders of Eustachian tube, left ear: Secondary | ICD-10-CM | POA: Diagnosis not present

## 2019-12-05 DIAGNOSIS — M9902 Segmental and somatic dysfunction of thoracic region: Secondary | ICD-10-CM | POA: Diagnosis not present

## 2019-12-05 DIAGNOSIS — M9904 Segmental and somatic dysfunction of sacral region: Secondary | ICD-10-CM | POA: Diagnosis not present

## 2019-12-05 DIAGNOSIS — M9903 Segmental and somatic dysfunction of lumbar region: Secondary | ICD-10-CM | POA: Diagnosis not present

## 2019-12-05 DIAGNOSIS — M5386 Other specified dorsopathies, lumbar region: Secondary | ICD-10-CM | POA: Diagnosis not present

## 2019-12-10 DIAGNOSIS — M9903 Segmental and somatic dysfunction of lumbar region: Secondary | ICD-10-CM | POA: Diagnosis not present

## 2019-12-10 DIAGNOSIS — M9904 Segmental and somatic dysfunction of sacral region: Secondary | ICD-10-CM | POA: Diagnosis not present

## 2019-12-10 DIAGNOSIS — M5386 Other specified dorsopathies, lumbar region: Secondary | ICD-10-CM | POA: Diagnosis not present

## 2019-12-10 DIAGNOSIS — M9902 Segmental and somatic dysfunction of thoracic region: Secondary | ICD-10-CM | POA: Diagnosis not present

## 2019-12-12 DIAGNOSIS — M9903 Segmental and somatic dysfunction of lumbar region: Secondary | ICD-10-CM | POA: Diagnosis not present

## 2019-12-12 DIAGNOSIS — M5386 Other specified dorsopathies, lumbar region: Secondary | ICD-10-CM | POA: Diagnosis not present

## 2019-12-12 DIAGNOSIS — M9902 Segmental and somatic dysfunction of thoracic region: Secondary | ICD-10-CM | POA: Diagnosis not present

## 2019-12-12 DIAGNOSIS — M9904 Segmental and somatic dysfunction of sacral region: Secondary | ICD-10-CM | POA: Diagnosis not present

## 2020-03-07 DIAGNOSIS — F411 Generalized anxiety disorder: Secondary | ICD-10-CM | POA: Diagnosis not present

## 2020-03-07 DIAGNOSIS — H6983 Other specified disorders of Eustachian tube, bilateral: Secondary | ICD-10-CM | POA: Diagnosis not present

## 2020-03-07 DIAGNOSIS — E559 Vitamin D deficiency, unspecified: Secondary | ICD-10-CM | POA: Diagnosis not present

## 2020-03-07 DIAGNOSIS — Z0000 Encounter for general adult medical examination without abnormal findings: Secondary | ICD-10-CM | POA: Diagnosis not present

## 2020-06-03 DIAGNOSIS — M9903 Segmental and somatic dysfunction of lumbar region: Secondary | ICD-10-CM | POA: Diagnosis not present

## 2020-06-03 DIAGNOSIS — M9904 Segmental and somatic dysfunction of sacral region: Secondary | ICD-10-CM | POA: Diagnosis not present

## 2020-06-03 DIAGNOSIS — M9902 Segmental and somatic dysfunction of thoracic region: Secondary | ICD-10-CM | POA: Diagnosis not present

## 2020-06-03 DIAGNOSIS — M5386 Other specified dorsopathies, lumbar region: Secondary | ICD-10-CM | POA: Diagnosis not present

## 2020-06-05 DIAGNOSIS — M9902 Segmental and somatic dysfunction of thoracic region: Secondary | ICD-10-CM | POA: Diagnosis not present

## 2020-06-05 DIAGNOSIS — M5386 Other specified dorsopathies, lumbar region: Secondary | ICD-10-CM | POA: Diagnosis not present

## 2020-06-05 DIAGNOSIS — M9904 Segmental and somatic dysfunction of sacral region: Secondary | ICD-10-CM | POA: Diagnosis not present

## 2020-06-05 DIAGNOSIS — M9903 Segmental and somatic dysfunction of lumbar region: Secondary | ICD-10-CM | POA: Diagnosis not present

## 2020-06-17 DIAGNOSIS — M5386 Other specified dorsopathies, lumbar region: Secondary | ICD-10-CM | POA: Diagnosis not present

## 2020-06-17 DIAGNOSIS — M9903 Segmental and somatic dysfunction of lumbar region: Secondary | ICD-10-CM | POA: Diagnosis not present

## 2020-06-17 DIAGNOSIS — M9902 Segmental and somatic dysfunction of thoracic region: Secondary | ICD-10-CM | POA: Diagnosis not present

## 2020-06-17 DIAGNOSIS — M9904 Segmental and somatic dysfunction of sacral region: Secondary | ICD-10-CM | POA: Diagnosis not present

## 2020-06-18 ENCOUNTER — Ambulatory Visit (INDEPENDENT_AMBULATORY_CARE_PROVIDER_SITE_OTHER): Payer: BC Managed Care – PPO | Admitting: Licensed Clinical Social Worker

## 2020-06-18 DIAGNOSIS — F411 Generalized anxiety disorder: Secondary | ICD-10-CM
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
## 2020-06-23 DIAGNOSIS — M9904 Segmental and somatic dysfunction of sacral region: Secondary | ICD-10-CM | POA: Diagnosis not present

## 2020-06-23 DIAGNOSIS — M5386 Other specified dorsopathies, lumbar region: Secondary | ICD-10-CM | POA: Diagnosis not present

## 2020-06-23 DIAGNOSIS — M9903 Segmental and somatic dysfunction of lumbar region: Secondary | ICD-10-CM | POA: Diagnosis not present

## 2020-06-23 DIAGNOSIS — M9902 Segmental and somatic dysfunction of thoracic region: Secondary | ICD-10-CM | POA: Diagnosis not present

## 2020-06-27 DIAGNOSIS — M9903 Segmental and somatic dysfunction of lumbar region: Secondary | ICD-10-CM | POA: Diagnosis not present

## 2020-06-27 DIAGNOSIS — M5442 Lumbago with sciatica, left side: Secondary | ICD-10-CM | POA: Diagnosis not present

## 2020-06-27 DIAGNOSIS — M5386 Other specified dorsopathies, lumbar region: Secondary | ICD-10-CM | POA: Diagnosis not present

## 2020-06-27 DIAGNOSIS — G8929 Other chronic pain: Secondary | ICD-10-CM | POA: Diagnosis not present

## 2020-06-27 DIAGNOSIS — M9904 Segmental and somatic dysfunction of sacral region: Secondary | ICD-10-CM | POA: Diagnosis not present

## 2020-06-27 DIAGNOSIS — M9902 Segmental and somatic dysfunction of thoracic region: Secondary | ICD-10-CM | POA: Diagnosis not present

## 2020-06-30 DIAGNOSIS — M5386 Other specified dorsopathies, lumbar region: Secondary | ICD-10-CM | POA: Diagnosis not present

## 2020-06-30 DIAGNOSIS — M9904 Segmental and somatic dysfunction of sacral region: Secondary | ICD-10-CM | POA: Diagnosis not present

## 2020-06-30 DIAGNOSIS — M9903 Segmental and somatic dysfunction of lumbar region: Secondary | ICD-10-CM | POA: Diagnosis not present

## 2020-06-30 DIAGNOSIS — M9902 Segmental and somatic dysfunction of thoracic region: Secondary | ICD-10-CM | POA: Diagnosis not present

## 2020-07-01 DIAGNOSIS — M545 Low back pain: Secondary | ICD-10-CM | POA: Diagnosis not present

## 2020-07-03 DIAGNOSIS — M545 Low back pain: Secondary | ICD-10-CM | POA: Diagnosis not present

## 2020-07-08 DIAGNOSIS — M545 Low back pain: Secondary | ICD-10-CM | POA: Diagnosis not present

## 2020-07-10 DIAGNOSIS — M545 Low back pain: Secondary | ICD-10-CM | POA: Diagnosis not present

## 2020-07-15 ENCOUNTER — Ambulatory Visit (INDEPENDENT_AMBULATORY_CARE_PROVIDER_SITE_OTHER): Payer: BC Managed Care – PPO | Admitting: Licensed Clinical Social Worker

## 2020-07-15 DIAGNOSIS — F411 Generalized anxiety disorder: Secondary | ICD-10-CM

## 2020-07-15 DIAGNOSIS — M545 Low back pain: Secondary | ICD-10-CM | POA: Diagnosis not present

## 2020-07-15 NOTE — Progress Notes (Signed)
Virtual Visit via Video Note  Therapist-office Patient-home I connected with Jordan Larson on 07/15/20 at  3:00 PM EDT by a video enabled telemedicine application and verified that I am speaking with the correct person using two identifiers.   I discussed the limitations of evaluation and management by telemedicine and the availability of in person appointments. The patient expressed understanding and agreed to proceed.   I discussed the assessment and treatment plan with the patient. The patient was provided an opportunity to ask questions and all were answered. The patient agreed with the plan and demonstrated an understanding of the instructions.   The patient was advised to call back or seek an in-person evaluation if the symptoms worsen or if the condition fails to improve as anticipated.  I provided 54 minutes of non-face-to-face time during this encounter.   THERAPIST PROGRESS NOTE  Session Time: 3:00 PM to 3:54 PM  Participation Level: Active  Behavioral Response: CasualAlertAnxious  Type of Therapy: Individual Therapy  Treatment Goals addressed: decrease in anxiety, learn coping skills  Interventions: CBT, Solution Focused, Strength-based, Supportive and Reframing  Summary: Jordan Larson is a 39 y.o. female who presents with something that escalates anxiety are things are not going the way they should. For example packing for a trip, things don't go as plan stress her out more, freak, yell. July was very good and calm. 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 Physically back issues on and off, threw out back, slipped disc, physical therapy two times a week, she has homework for this and doesn't have time, doesn't have time to do the stretches. Struggle with self-care. Knows needs to find ways to put  self-care ahead usually put last. Have to prioritize now due to issues with physical health.When school goes back will have mornings to herself.  Reviewed education on panic and patient identifies with stomach turns, gets nauseous and inevitably this pattern leads her to go to bathroom.  Mouth dry eyes, muscles tense this happens when she is talking about bad news work-related money issues.  Goes to the bathroom with upset stomach.  Reviewed session patient said information and video  affirming thoughts had about her anxiety as  accurate after looking a video with that can now develop coping strategies.   Suicidal/Homicidal: No  Therapist Response: Therapist reviewed symptoms, facilitated expression of thoughts and feelings completed treatment plan and patient gave consent to complete through video.  Introduced psychoeducation on CBT explaining how thoughts and behaviors as well as physiological processes play a part in anxiety.  Discussed how we can challenge the inaccurate thoughts, change focus of attention that will help with management of anxiety.  Discussed at the heart anxiety and panic are misfiring of fighter flight thinking and misinterpreting something is dangerous.  Since this is misfiring symptoms or not dangerous thus helping [place] not to catastrophize symptoms that only escalate them.  Discussed how anxiety is best to be tested not trusted.  Explored in patient's particular case will be helpful for certain character traits such as excessive need to control the play factor as well as tendency to ignore physical and psychological signs of stress and that self-care will be important factor for her managing anxiety.  Plan: Return again in 2 weeks.2.  Introduced mindfulness, introduced metaphors of ACT,  introduced personality characteristics for people with anxiety 3.Patient work on self-care review website CBT self-help [place]  Diagnosis: Axis I: Generalized Anxiety Disorder    Axis II: No  diagnosis    Coolidge Breeze, LCSW 07/15/2020

## 2020-07-17 DIAGNOSIS — M545 Low back pain: Secondary | ICD-10-CM | POA: Diagnosis not present

## 2020-07-18 DIAGNOSIS — G8929 Other chronic pain: Secondary | ICD-10-CM | POA: Diagnosis not present

## 2020-07-18 DIAGNOSIS — M5442 Lumbago with sciatica, left side: Secondary | ICD-10-CM | POA: Diagnosis not present

## 2020-07-23 DIAGNOSIS — M545 Low back pain: Secondary | ICD-10-CM | POA: Diagnosis not present

## 2020-07-25 DIAGNOSIS — M545 Low back pain: Secondary | ICD-10-CM | POA: Diagnosis not present

## 2020-07-28 ENCOUNTER — Ambulatory Visit (INDEPENDENT_AMBULATORY_CARE_PROVIDER_SITE_OTHER): Payer: BC Managed Care – PPO | Admitting: Licensed Clinical Social Worker

## 2020-07-28 DIAGNOSIS — F411 Generalized anxiety disorder: Secondary | ICD-10-CM

## 2020-07-28 NOTE — Progress Notes (Addendum)
Virtual Visit via Video Note  Therapist-office Patient-home I connected with Jordan Larson on 07/28/20 at  2:00 PM EDT by a video enabled telemedicine application and verified that I am speaking with the correct person using two identifiers.   I discussed the limitations of evaluation and management by telemedicine and the availability of in person appointments. The patient expressed understanding and agreed to proceed.   I discussed the assessment and treatment plan with the patient. The patient was provided an opportunity to ask questions and all were answered. The patient agreed with the plan and demonstrated an understanding of the instructions.   The patient was advised to call back or seek an in-person evaluation if the symptoms worsen or if the condition fails to improve as anticipated.  I provided 54 minutes of non-face-to-face time during this encounter.  THERAPIST PROGRESS NOTE  Session Time: 3:00 PM to 3:54 PM  Participation Level: Active  Behavioral Response: CasualAlertAnxious  Type of Therapy: Individual Therapy  Treatment Goals addressed:   decrease in anxiety, learn coping skills Interventions: CBT, Solution Focused, Strength-based and Other: ACT, relaxation, coping  Summary: Jordan Larson is a 39 y.o. female who presents with things are about the same as far as symptoms going to physical therapy to help her back. Suppose to be doing stretches twice a day and hard time fitting in and needs to. Needs to focus more on self-care may happen once the kids get back to school. Things are crazy two older going back to school. Everything talked about made sense in her head causing the panic. Issue for her is "what can I do how can I convince myself at those times". Anxiety going on and what strategies that can help her anxiety to come back down. Does take deep breaths. Trying but not being effective to convince herself it is her and not a big deal so ways to calm it down.  Discussed relaxation skills and patient said she had post partum anxiety racing thoughts and couldn't sleep. Used an app to help breath and to help relax. Her issue was that she would wake up and couldn't get back to sleep. Meditating app. It did help. Started medication most drastic difference.  Reviewed strategies (see below) and patient thinks work sheet on coping skills with anxiety will be helpful.  She likes exercise for muscle relaxation because she noticed that she tenses up good strategy to help when tense.  Both the deep breathing and muscle relaxation.  Therapist encouraged regular practice and when notices tense be effective at using muscle relaxation.       Suicidal/Homicidal: No  Therapist Response: Reviewed symptoms, facilitated expression of thoughts and feelings, reviewed cognitive challenges to anxiety producing thoughts by challenging the evidence for them, other questions such as will this be important in a few days, is the worry bugging you about unnecessary things.  Introduced cognitive defusion for patient to get distance from thoughts, using mindfulness skill. Introduced mindfulness as an Geographical information systems officer, using it to become more aware of thoughts, emotions and distancing herself from them. Statements such as there is that thought again helps her to distance herself.  Discussed mindfulness in general as helpful for decrease of anxiety as a regular practice of relaxation, as a mood enhancer for example noticing small things in the day that she enjoys by paying more attention using mindfulness.  Discussed benefits but also different types of exercises to practice to develop the skill. Emphasized importance of relaxation that the  heart of anxiety is fight/flight mode your brain often misinterprets something as dangerous, has released stress hormones and activated your sympathetic nervous system to ready your body for quick action.  The better tuned our nervous system is less likely  we are to experience unmanageable anxiety and fright.  Discussed other practices as relaxation including deep muscle relaxation. Also introduced lovingkindness meditation as an exercise to decrease distressing emotion, how there are different types of relaxation strategies to learn and to find what works best.  Continue to encourage patient with self-care, encourage lifestyle changes to include adopting self talk and core beliefs which promote a calmer more accepting attitude toward life, learning to knowledge and express her feelings.  Therapist provided active listening open questions supportive interventions  Plan: Return again in 2-3 weeks.2.  Patient review worksheet on coping skills for anxiety and practice deep breathing and muscle relaxation, report back to therapy whether effective 3.  Therapist work with patient on other mindfulness activities, identifying cognitive distortions to work on Insurance account manager of anxiety    Diagnosis: Axis I:  Generalized Anxiety Disorder    Axis II: No diagnosis    Coolidge Breeze, LCSW 07/28/2020

## 2020-07-29 DIAGNOSIS — M545 Low back pain: Secondary | ICD-10-CM | POA: Diagnosis not present

## 2020-07-31 DIAGNOSIS — Z03818 Encounter for observation for suspected exposure to other biological agents ruled out: Secondary | ICD-10-CM | POA: Diagnosis not present

## 2020-07-31 DIAGNOSIS — J069 Acute upper respiratory infection, unspecified: Secondary | ICD-10-CM | POA: Diagnosis not present

## 2020-07-31 DIAGNOSIS — M545 Low back pain: Secondary | ICD-10-CM | POA: Diagnosis not present

## 2020-08-06 DIAGNOSIS — M545 Low back pain: Secondary | ICD-10-CM | POA: Diagnosis not present

## 2020-08-14 DIAGNOSIS — M545 Low back pain: Secondary | ICD-10-CM | POA: Diagnosis not present

## 2020-08-19 ENCOUNTER — Ambulatory Visit (INDEPENDENT_AMBULATORY_CARE_PROVIDER_SITE_OTHER): Payer: BC Managed Care – PPO | Admitting: Licensed Clinical Social Worker

## 2020-08-19 DIAGNOSIS — F411 Generalized anxiety disorder: Secondary | ICD-10-CM

## 2020-08-19 NOTE — Progress Notes (Signed)
Virtual Visit via Video Note  Therapist-home office Patient-home I connected with Jordan Larson on 08/19/20 at  2:00 PM EDT by a video enabled telemedicine application and verified that I am speaking with the correct person using two identifiers.   I discussed the limitations of evaluation and management by telemedicine and the availability of in person appointments. The patient expressed understanding and agreed to proceed.   I discussed the assessment and treatment plan with the patient. The patient was provided an opportunity to ask questions and all were answered. The patient agreed with the plan and demonstrated an understanding of the instructions.   The patient was advised to call back or seek an in-person evaluation if the symptoms worsen or if the condition fails to improve as anticipated.  I provided 40 minutes of non-face-to-face time during this encounter.  THERAPIST PROGRESS NOTE  Session Time: 2:00 PM to 2:40 PM  Participation Level: Active  Behavioral Response: CasualAlertAnxious and Euthymic  Type of Therapy: Individual Therapy  Treatment Goals addressed:  decrease in anxiety, learn coping skills Interventions: CBT, DBT, Solution Focused, Strength-based and Supportive  Summary: Jordan Larson is a 39 y.o. female who presents with relaxation as helpful, trying to find time to do exercises for physical therapy so practices relaxation while doing physical therapy. Also utilizing and working on thought challenge. Anxiety is pretty good. School year under way and a lot of classes. Helps when carve out time and sit in the office and focus. That helps her to get her work done more effectively and then can stay more part of the family. Stepping away helping to get it down faster. 2-3 hours a day. Hard time to get that much time to herself is the only problem. Interested in incorporating mindfulness into sessions. Identifies overgeneralization, mind reading and disqualifying  the positive as cognitive distortions she uses. .    Suicidal/Homicidal: No  Therapist Response: Therapist reviewed symptoms facilitated expression of thoughts and feelings identified including thought challenging and relaxation.  Discussed with tricks that anxiety can play so 1 can catch them earlier such as false alarms, that things are not ok.  Discussed particular tricks with generalized anxiety disorder that you have to be on high alert constantly otherwise you are irresponsible and that leaves you or others you care about at high risk for bad things happening in second of your you hear about any trouble pertains to you directly and you need to do everything to prevent it.  Discussed thought challenges particular to generalized anxiety asking what is most likely to happen?  If you were to have $1 million what would be the correct answer?  Is the real problem that worries bugging you about unnecessary things.  Discussed core skill of mindfulness helpful for emotional regulation as 1 practices being more more aware of thoughts and feelings 1 better able to utilize a skill to regulate.  Discussed different exercise utilized to Practice mindfulness and that it is a skill she is to work on developing.  Will practice at each session.  Discussed helpfulness of monitoring mood, utilizing a suds scale To rate area of distress and where she can pay attention to thoughts, use skills to intervene and help regulate mood effectively.  Patient can you chain analysis is helpful in deciding what skill to use and how to interrupt chain of events for better mood regulation.  Introduced cognitive distortions pointed out this is a way for patient to identify distortions she uses and helpful to  recognize in her thought challenge.  Therapist provided active listening open questions supportive interventions  Plan: Return again in 2 weeks.2.Patient incorporate coping skills to help in decrease in anxiety. 3.Therapist education  patient on coping skills and incorporate mindfulness into session.   Diagnosis: Axis I:  Generalized Anxiety Disorder    Axis II: No diagnosis    Coolidge Breeze, LCSW 08/19/2020

## 2020-09-15 ENCOUNTER — Ambulatory Visit (HOSPITAL_COMMUNITY): Payer: BC Managed Care – PPO | Admitting: Licensed Clinical Social Worker

## 2021-01-12 DIAGNOSIS — Z20822 Contact with and (suspected) exposure to covid-19: Secondary | ICD-10-CM | POA: Diagnosis not present

## 2021-02-20 DIAGNOSIS — Z1159 Encounter for screening for other viral diseases: Secondary | ICD-10-CM | POA: Diagnosis not present

## 2021-04-15 ENCOUNTER — Other Ambulatory Visit: Payer: Self-pay | Admitting: Obstetrics and Gynecology

## 2021-04-15 DIAGNOSIS — Z1231 Encounter for screening mammogram for malignant neoplasm of breast: Secondary | ICD-10-CM

## 2021-04-17 DIAGNOSIS — Z1159 Encounter for screening for other viral diseases: Secondary | ICD-10-CM | POA: Diagnosis not present

## 2021-04-27 DIAGNOSIS — Z1283 Encounter for screening for malignant neoplasm of skin: Secondary | ICD-10-CM | POA: Diagnosis not present

## 2021-04-27 DIAGNOSIS — Z13220 Encounter for screening for lipoid disorders: Secondary | ICD-10-CM | POA: Diagnosis not present

## 2021-04-27 DIAGNOSIS — F411 Generalized anxiety disorder: Secondary | ICD-10-CM | POA: Diagnosis not present

## 2021-04-27 DIAGNOSIS — Z0000 Encounter for general adult medical examination without abnormal findings: Secondary | ICD-10-CM | POA: Diagnosis not present

## 2021-05-05 ENCOUNTER — Ambulatory Visit: Payer: BC Managed Care – PPO | Admitting: Obstetrics and Gynecology

## 2021-05-06 ENCOUNTER — Ambulatory Visit: Payer: BC Managed Care – PPO

## 2021-05-06 ENCOUNTER — Ambulatory Visit (INDEPENDENT_AMBULATORY_CARE_PROVIDER_SITE_OTHER): Payer: BC Managed Care – PPO | Admitting: Obstetrics and Gynecology

## 2021-05-06 ENCOUNTER — Encounter: Payer: Self-pay | Admitting: Obstetrics and Gynecology

## 2021-05-06 ENCOUNTER — Other Ambulatory Visit: Payer: Self-pay

## 2021-05-06 ENCOUNTER — Ambulatory Visit (INDEPENDENT_AMBULATORY_CARE_PROVIDER_SITE_OTHER): Payer: BC Managed Care – PPO

## 2021-05-06 ENCOUNTER — Other Ambulatory Visit (HOSPITAL_COMMUNITY)
Admission: RE | Admit: 2021-05-06 | Discharge: 2021-05-06 | Disposition: A | Discharge status: Home / Self Care | Payer: BC Managed Care – PPO | Source: Ambulatory Visit | Attending: Obstetrics and Gynecology | Admitting: Obstetrics and Gynecology

## 2021-05-06 VITALS — BP 108/74 | HR 62 | Resp 16 | Ht 68.0 in | Wt 176.0 lb

## 2021-05-06 DIAGNOSIS — Z01419 Encounter for gynecological examination (general) (routine) without abnormal findings: Secondary | ICD-10-CM | POA: Insufficient documentation

## 2021-05-06 DIAGNOSIS — Z1231 Encounter for screening mammogram for malignant neoplasm of breast: Secondary | ICD-10-CM | POA: Diagnosis not present

## 2021-05-06 MED ORDER — NORETHIN ACE-ETH ESTRAD-FE 1-20 MG-MCG(24) PO TABS: 1.0000 | ORAL_TABLET | Freq: Every day | ORAL | 11 refills | Status: DC

## 2021-05-06 NOTE — Progress Notes (Signed)
GYNECOLOGY ANNUAL PREVENTATIVE CARE ENCOUNTER NOTE  History:     Jordan Larson is a 40 y.o. [phone] female here for a routine annual gynecologic exam.  Current complaints: None.   Denies abnormal vaginal bleeding, discharge, pelvic pain, problems with intercourse or other gynecologic concerns.    Gynecologic History Patient's last menstrual period was 04/26/2021. Contraception: none- interested in Pills vs IUD  Last Pap: 2020. Results were: normal with negative HPV Last mammogram: Today, 05/06/2021- results pending.   Obstetric History OB History  Gravida Para Term Preterm AB Living  4 4 4     4   SAB IAB Ectopic Multiple Live Births        0 4    # Outcome Date GA Lbr Len/2nd Weight Sex Delivery Anes PTL Lv  4 Term 07/21/19 [redacted]w[redacted]d 03:29 / 00:12 8 lb 0.9 oz (3.654 kg) M Vag-Spont EPI  LIV  3 Term 10/18/16 [redacted]w[redacted]d 01:15 / 00:33 7 lb 13.2 oz (3.55 kg) F Vag-Spont EPI  LIV     Complications: Precipitate labor  2 Term 02/12/13 [redacted]w[redacted]d  6 lb 15 oz (3.147 kg) F Vag-Spont EPI  LIV     Complications: Precipitate labor  1 Term 04/03/10 [redacted]w[redacted]d  8 lb 3 oz (3.714 kg) M Vag-Vacuum EPI  LIV     Complications: Precipitate labor    Past Medical History:  Diagnosis Date  . C. difficile enteritis 2011   Sensitive to antibiotics; does well on Amoxicillin  . Cervical polyp   . Labor, precipitous, delivered   . Medical history non-contributory   . Vasovagal syncope     Past Surgical History:  Procedure Laterality Date  . COLONOSCOPY    . WISDOM TOOTH EXTRACTION      Current Outpatient Medications on File Prior to Visit  Medication Sig Dispense Refill  . bifidobacterium infantis (ALIGN) capsule Take 1 capsule by mouth at bedtime.    . cetirizine (ZYRTEC) 10 MG tablet Take 10 mg by mouth at bedtime.     . fluticasone (FLONASE) 50 MCG/ACT nasal spray Place into both nostrils daily.    2012 ibuprofen (ADVIL) 600 MG tablet Take 1 tablet (600 mg total) by mouth every 6 (six) hours. 30 tablet 1   . Sertraline HCl (ZOLOFT PO) Take by mouth.    Marland Kitchen acetaminophen (TYLENOL) 325 MG tablet Take 2 tablets (650 mg total) by mouth every 4 (four) hours as needed (for pain scale < 4). (Patient not taking: Reported on 05/06/2021) 60 tablet 0  . ferrous sulfate 325 (65 FE) MG tablet Take 1 tablet (325 mg total) by mouth 2 (two) times daily with a meal. (Patient not taking: Reported on 05/06/2021) 60 tablet 3  . senna-docusate (SENOKOT-S) 8.6-50 MG tablet Take 2 tablets by mouth daily. (Patient not taking: Reported on 05/06/2021) 60 tablet 1   No current facility-administered medications on file prior to visit.    No Known Allergies  Social History:  reports that she has never smoked. She has never used smokeless tobacco. She reports that she does not drink alcohol and does not use drugs.  Family History  Problem Relation Age of Onset  . Diabetes Father   . Hypertension Paternal Grandmother   . COPD Paternal Grandmother   . Skin cancer Mother   . Breast cancer Maternal Grandmother 50  . Vision loss Maternal Grandmother        glaucoma  . Cancer Maternal Grandfather        colon  . Breast  cancer Maternal Aunt     The following portions of the patient's history were reviewed and updated as appropriate: allergies, current medications, past family history, past medical history, past social history, past surgical history and problem list.  Review of Systems Pertinent items noted in HPI and remainder of comprehensive ROS otherwise negative.  Physical Exam:  BP 108/74   Pulse 62   Resp 16   Ht 5\' 8"  (1.727 m)   Wt 176 lb (79.8 kg)   LMP 04/26/2021   Breastfeeding No   BMI 26.76 kg/m  CONSTITUTIONAL: Well-developed, well-nourished female in no acute distress.  HENT:  Normocephalic, atraumatic, External right and left ear normal.  EYES: Conjunctivae and EOM are normal. Pupils are equal, round, and reactive to light. No scleral icterus.  NECK: Normal range of motion, supple, no masses.   Normal thyroid.  SKIN: Skin is warm and dry. No rash noted. Not diaphoretic. No erythema. No pallor. MUSCULOSKELETAL: Normal range of motion. No tenderness.  No cyanosis, clubbing, or edema. NEUROLOGIC: Alert and oriented to person, place, and time. Normal reflexes, muscle tone coordination.  PSYCHIATRIC: Normal mood and affect. Normal behavior. Normal judgment and thought content. CARDIOVASCULAR: Normal heart rate noted, regular rhythm RESPIRATORY: Clear to auscultation bilaterally. Effort and breath sounds normal, no problems with respiration noted. BREASTS: Symmetric in size. No masses, tenderness, skin changes, nipple drainage, or lymphadenopathy bilaterally. Performed in the presence of a chaperone. ABDOMEN: Soft, no distention noted.  No tenderness, rebound or guarding.  PELVIC: Normal appearing external genitalia and urethral meatus; normal appearing vaginal mucosa and cervix.  No abnormal discharge noted.  Pap smear obtained.  Normal uterine size, no other palpable masses, no uterine or adnexal tenderness.  Performed in the presence of a chaperone.   Assessment and Plan:    1. Well woman exam with routine gynecological exam  - Cytology - PAP( [name]) - Rx: Loestrin, she is considering IUD. Will call and schedule if she desires. ' - Mammogram done today.   Will follow up results of pap smear and manage accordingly. Mammogram scheduled Routine preventative health maintenance measures emphasized. Please refer to After Visit Summary for other counseling recommendations.        Wes Lezotte, 04/28/2021, NP [facility] Jordan L[facility], [facility]

## 2021-05-12 LAB — CYTOLOGY - PAP
Comment: NEGATIVE
Diagnosis: UNDETERMINED — AB
High risk HPV: NEGATIVE

## 2022-04-13 ENCOUNTER — Other Ambulatory Visit: Payer: Self-pay | Admitting: *Deleted

## 2022-04-13 MED ORDER — NORETHIN ACE-ETH ESTRAD-FE 1-20 MG-MCG(24) PO TABS
1.0000 | ORAL_TABLET | Freq: Every day | ORAL | 1 refills | Status: DC
Start: 2022-04-13 — End: 2022-05-06

## 2022-04-13 NOTE — Telephone Encounter (Cosign Needed)
Pt called requesting a RF on her OCP's.  2 RF's sent to her pharmacy and pt is aware that she need appt for her annual gyn before Rf's run out. ?

## 2022-05-06 ENCOUNTER — Other Ambulatory Visit: Payer: Self-pay | Admitting: *Deleted

## 2022-05-06 MED ORDER — NORETHIN ACE-ETH ESTRAD-FE 1-20 MG-MCG(24) PO TABS: 1.0000 | ORAL_TABLET | Freq: Every day | ORAL | 0 refills | Status: DC

## 2022-05-06 NOTE — Telephone Encounter (Signed)
1 Rf on OCP's sent to pt's pharmacy and my chart message sent to pt that this is the last RF on OCP until she schedules her annual.

## 2022-06-03 ENCOUNTER — Other Ambulatory Visit: Payer: Self-pay | Admitting: *Deleted

## 2022-06-03 ENCOUNTER — Telehealth: Payer: Self-pay | Admitting: *Deleted

## 2022-06-03 MED ORDER — NORETHIN ACE-ETH ESTRAD-FE 1-20 MG-MCG(24) PO TABS
1.0000 | ORAL_TABLET | Freq: Every day | ORAL | 12 refills | Status: DC
Start: 2022-06-03 — End: 2024-05-01

## 2022-06-03 NOTE — Telephone Encounter (Signed)
LM on voicemail that her OCP's were RF'd but she is overdue for her annual and no further RF's until appt.  Pharmacy notified as well.

## 2022-06-09 DIAGNOSIS — Z1329 Encounter for screening for other suspected endocrine disorder: Secondary | ICD-10-CM | POA: Diagnosis not present

## 2022-06-09 DIAGNOSIS — Z0000 Encounter for general adult medical examination without abnormal findings: Secondary | ICD-10-CM | POA: Diagnosis not present

## 2022-06-09 DIAGNOSIS — Z13220 Encounter for screening for lipoid disorders: Secondary | ICD-10-CM | POA: Diagnosis not present

## 2022-06-09 DIAGNOSIS — F411 Generalized anxiety disorder: Secondary | ICD-10-CM | POA: Diagnosis not present

## 2022-07-02 DIAGNOSIS — H6093 Unspecified otitis externa, bilateral: Secondary | ICD-10-CM | POA: Diagnosis not present

## 2022-07-06 DIAGNOSIS — H6692 Otitis media, unspecified, left ear: Secondary | ICD-10-CM | POA: Diagnosis not present

## 2022-07-06 DIAGNOSIS — H6982 Other specified disorders of Eustachian tube, left ear: Secondary | ICD-10-CM | POA: Diagnosis not present

## 2022-07-15 DIAGNOSIS — H60312 Diffuse otitis externa, left ear: Secondary | ICD-10-CM | POA: Diagnosis not present

## 2022-10-08 ENCOUNTER — Ambulatory Visit (HOSPITAL_BASED_OUTPATIENT_CLINIC_OR_DEPARTMENT_OTHER)
Admission: RE | Admit: 2022-10-08 | Discharge: 2022-10-08 | Disposition: A | Discharge status: Home / Self Care | Payer: BC Managed Care – PPO | Source: Ambulatory Visit | Attending: Diagnostic Radiology | Admitting: Diagnostic Radiology

## 2022-10-08 DIAGNOSIS — Z23 Encounter for immunization: Secondary | ICD-10-CM | POA: Diagnosis not present

## 2022-10-08 DIAGNOSIS — Z1231 Encounter for screening mammogram for malignant neoplasm of breast: Secondary | ICD-10-CM | POA: Insufficient documentation

## 2022-10-13 ENCOUNTER — Other Ambulatory Visit: Payer: Self-pay

## 2022-10-13 ENCOUNTER — Other Ambulatory Visit: Payer: Self-pay | Admitting: Obstetrics and Gynecology

## 2022-10-13 DIAGNOSIS — R928 Other abnormal and inconclusive findings on diagnostic imaging of breast: Secondary | ICD-10-CM

## 2022-10-25 ENCOUNTER — Ambulatory Visit
Admission: RE | Admit: 2022-10-25 | Discharge: 2022-10-25 | Disposition: A | Discharge status: Home / Self Care | Payer: BC Managed Care – PPO | Source: Ambulatory Visit | Attending: Obstetrics and Gynecology | Admitting: Obstetrics and Gynecology

## 2022-10-25 DIAGNOSIS — R92322 Mammographic fibroglandular density, left breast: Secondary | ICD-10-CM | POA: Diagnosis not present

## 2022-10-25 DIAGNOSIS — N6489 Other specified disorders of breast: Secondary | ICD-10-CM | POA: Diagnosis not present

## 2022-10-25 DIAGNOSIS — R928 Other abnormal and inconclusive findings on diagnostic imaging of breast: Secondary | ICD-10-CM

## 2023-05-01 IMAGING — MG MM DIGITAL SCREENING BILAT W/ TOMO AND CAD
8 series · 8 of 24 positions shown · non-contrast
Comparison: None.

CLINICAL DATA: Screening.

EXAM:
DIGITAL SCREENING BILATERAL MAMMOGRAM WITH TOMOSYNTHESIS AND CAD
TECHNIQUE: Bilateral screening digital craniocaudal and mediolateral oblique
mammograms were obtained. Bilateral screening digital breast
tomosynthesis was performed. The images were evaluated with
computer-aided detection.

[R MLO synth-2D]
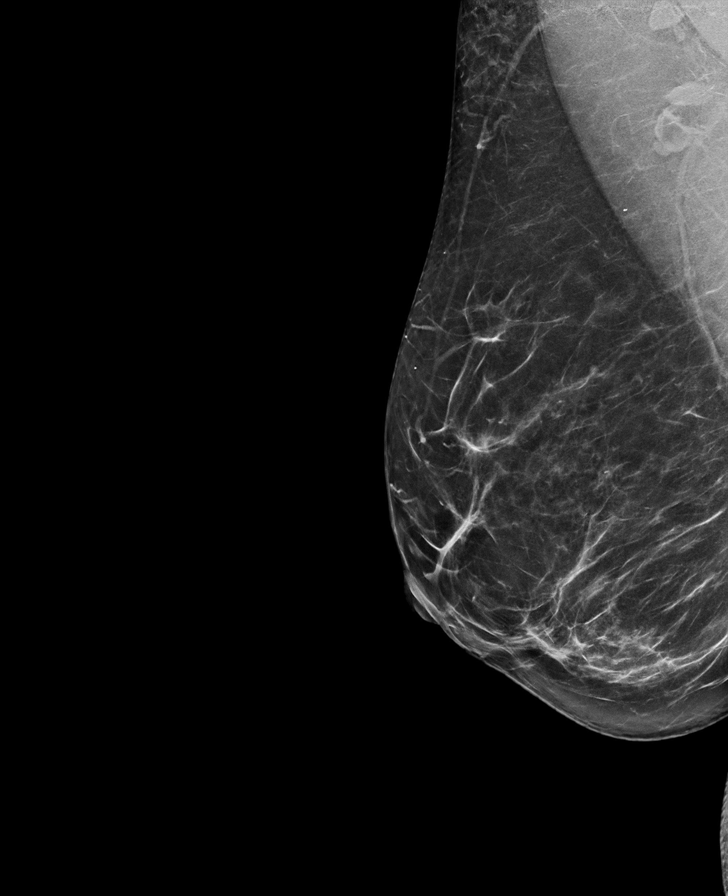

[R CC synth-2D]
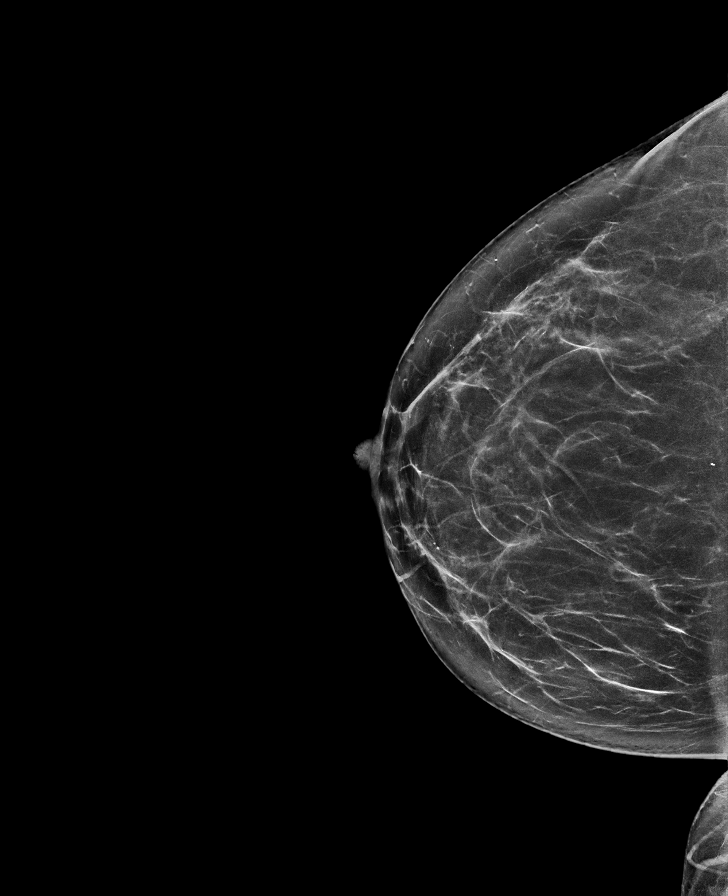

[L CC synth-2D]
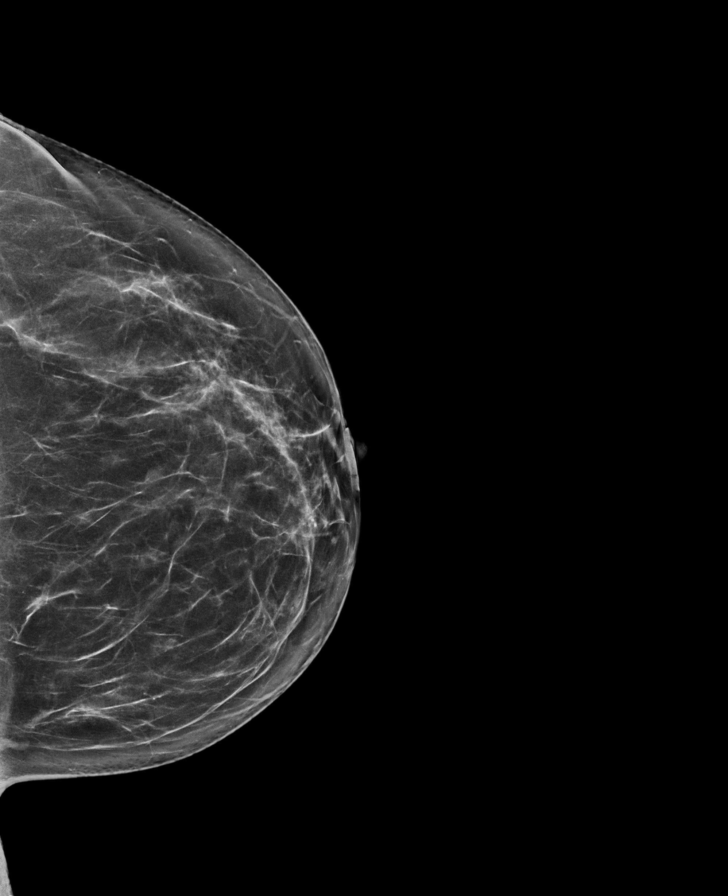

[L MLO synth-2D]
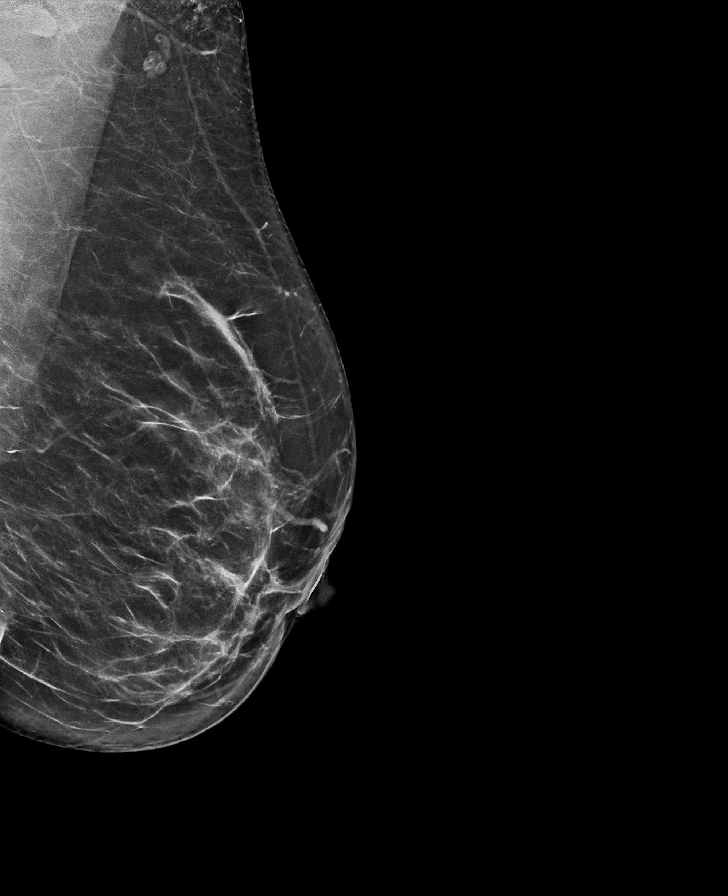

[R CC tomo · tomo slice 37/72.0]
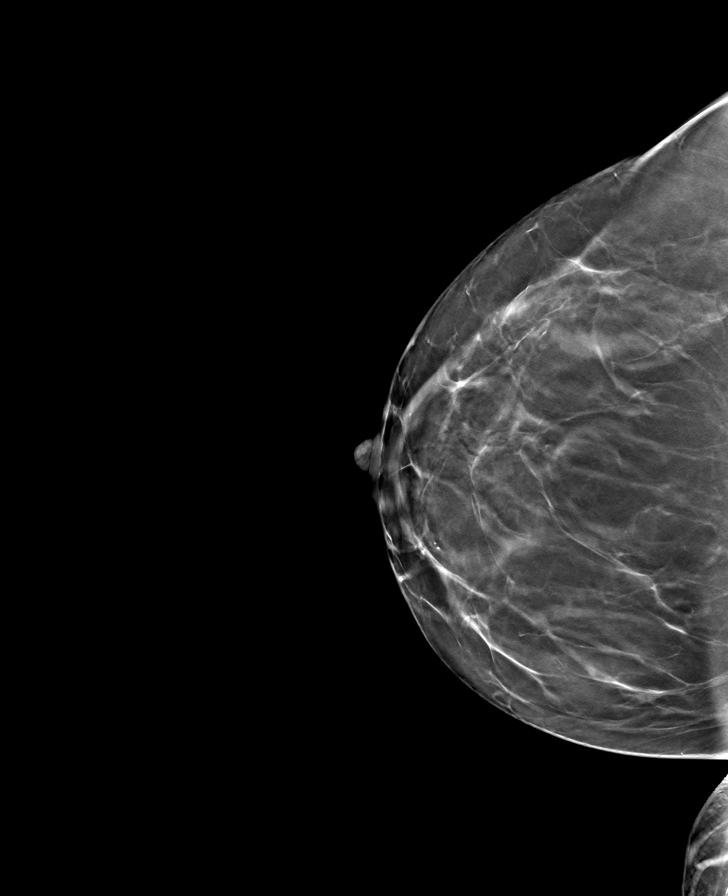

[L CC tomo · tomo slice 37/72.0]
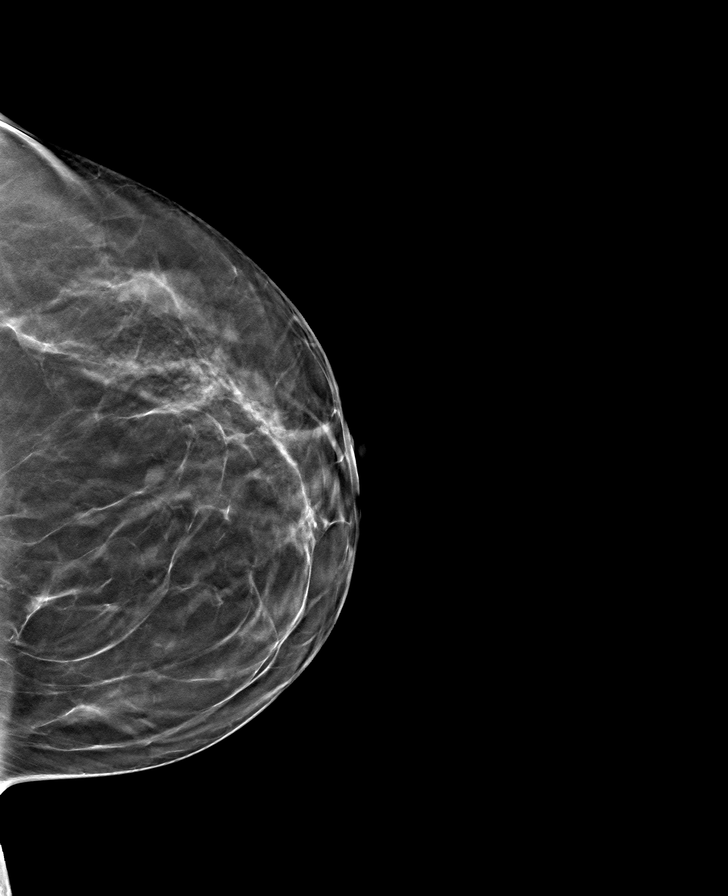

[L MLO tomo · tomo slice 37/74.0]
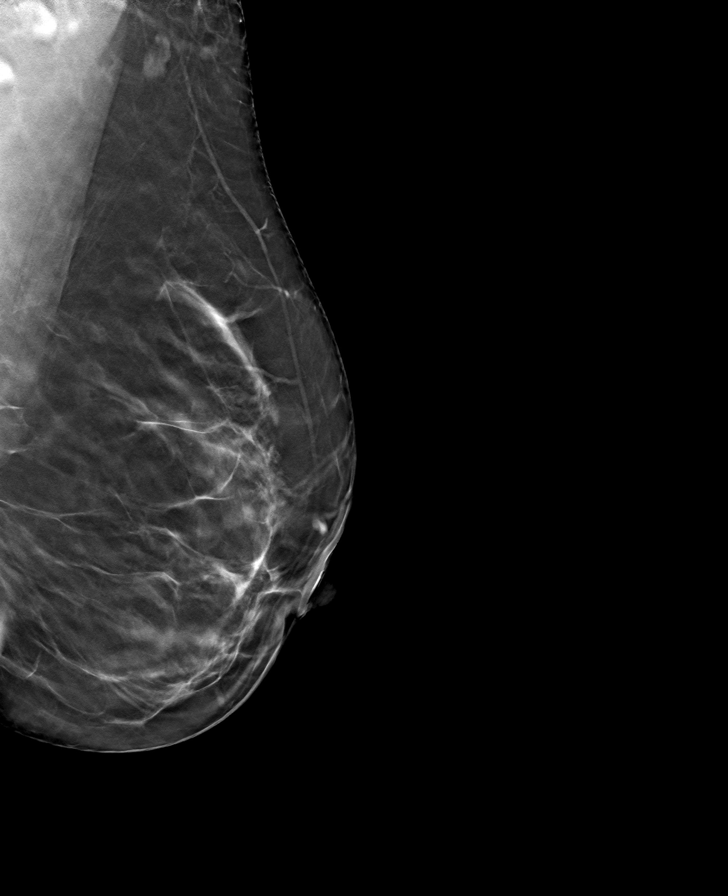

[R MLO tomo · tomo slice 38/75.0]
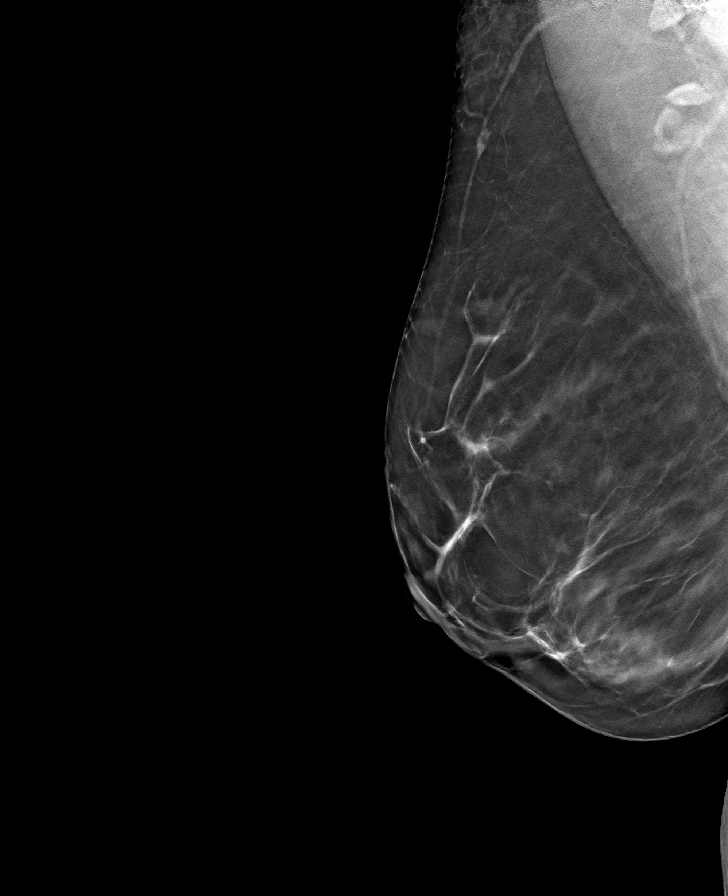

[8 of 24 positions shown; findings below may reference images not displayed]

ACR Breast Density Category b: There are scattered areas of
fibroglandular density.
FINDINGS: There are no findings suspicious for malignancy. The images were
evaluated with computer-aided detection.
IMPRESSION: No mammographic evidence of malignancy. A result letter of this
screening mammogram will be mailed directly to the patient.

RECOMMENDATION:
Screening mammogram in one year. (Code:C7-6-ASJ)

BI-RADS CATEGORY  1: Negative.

## 2023-11-01 NOTE — Progress Notes (Unsigned)
   ANNUAL EXAM Patient name: Jordan Larson MRN [id number]  Date of birth: [birth year] Chief Complaint:   No chief complaint on file.  History of Present Illness:   Jordan Larson is a 42 y.o. [phone] female being seen today for a routine annual exam.   Current concerns: ***  Current birth control: ***  No LMP recorded.   Last MXR: 10/2022 Last Pap/Pap History:  2020: pap/hpv wnl 04/2021: ASCUS/HPV neg   Health Maintenance Due  Topic Date Due   Hepatitis C Screening  Never done   INFLUENZA VACCINE  07/14/2023    Review of Systems:   Pertinent items are noted in HPI Denies any headaches, blurred vision, fatigue, shortness of breath, chest pain, abdominal pain, abnormal vaginal discharge/itching/odor/irritation, problems with periods, bowel movements, urination, or intercourse unless otherwise stated above. *** Pertinent History Reviewed:  Reviewed past medical,surgical, social and family history.  Reviewed problem list, medications and allergies. Physical Assessment:  There were no vitals filed for this visit.There is no height or weight on file to calculate BMI.   Physical Examination:  General appearance - well appearing, and in no distress Mental status - alert, oriented to person, place, and time Psych:  She has a normal mood and affect Skin - warm and dry, normal color, no suspicious lesions noted Chest - effort normal Heart - normal rate  Breasts - breasts appear normal, no suspicious masses, no skin or nipple changes or axillary nodes Abdomen - soft, nontender, nondistended, no masses or organomegaly Pelvic -  VULVA: normal appearing vulva with no masses, tenderness or lesions  VAGINA: normal appearing vagina with normal color and discharge, no lesions  CERVIX: normal appearing cervix without discharge or lesions, no CMT UTERUS: uterus is felt to be normal size, shape, consistency and nontender  ADNEXA: No adnexal masses or tenderness noted. Extremities:  No  swelling or varicosities noted  Chaperone present for exam  No results found for this or any previous visit (from the past 24 hour(s)).  Assessment & Plan:  Diagnoses and all orders for this visit:  Encounter for annual routine gynecological examination  - Cervical cancer screening: Discussed guidelines. Pap with HPV done - Gardasil: {Blank single:19197::"***","has not yet had. Will provide information","completed","has not yet had. Counseling provided and she declines","Has not yet had. Counseling provided and pt accepts"} - STD Testing: {Blank single:19197::"accepts","declines","not indicated"} - Birth Control: Discussed options and their risks, benefits and common side effects; discussed VTE with estrogen containing options. Desires: {Birth control type:23956} - Breast Health: Encouraged self breast awareness/SBE. Teaching provided. Discussed limits of clinical breast exam for detecting breast cancer. Rx given for MXR - F/U 12 months and prn     No orders of the defined types were placed in this encounter.   Meds: No orders of the defined types were placed in this encounter.   Follow-up: No follow-ups on file.  Milas Hock, MD 11/01/2023 2:42 PM

## 2023-11-02 ENCOUNTER — Other Ambulatory Visit (HOSPITAL_COMMUNITY)
Admission: RE | Admit: 2023-11-02 | Discharge: 2023-11-02 | Disposition: A | Discharge status: Home / Self Care | Payer: BC Managed Care – PPO | Source: Ambulatory Visit | Attending: Obstetrics and Gynecology | Admitting: Obstetrics and Gynecology

## 2023-11-02 ENCOUNTER — Ambulatory Visit (INDEPENDENT_AMBULATORY_CARE_PROVIDER_SITE_OTHER): Payer: BC Managed Care – PPO | Admitting: Obstetrics and Gynecology

## 2023-11-02 ENCOUNTER — Encounter: Payer: Self-pay | Admitting: Obstetrics and Gynecology

## 2023-11-02 VITALS — BP 119/78 | HR 71 | Ht 68.0 in | Wt 187.0 lb

## 2023-11-02 DIAGNOSIS — Z01419 Encounter for gynecological examination (general) (routine) without abnormal findings: Secondary | ICD-10-CM | POA: Diagnosis present

## 2023-11-02 DIAGNOSIS — Z01411 Encounter for gynecological examination (general) (routine) with abnormal findings: Secondary | ICD-10-CM | POA: Diagnosis not present

## 2023-11-02 DIAGNOSIS — Z1151 Encounter for screening for human papillomavirus (HPV): Secondary | ICD-10-CM

## 2023-11-02 DIAGNOSIS — R635 Abnormal weight gain: Secondary | ICD-10-CM | POA: Diagnosis not present

## 2023-11-02 DIAGNOSIS — R9230 Dense breasts, unspecified: Secondary | ICD-10-CM | POA: Diagnosis not present

## 2023-11-02 DIAGNOSIS — N924 Excessive bleeding in the premenopausal period: Secondary | ICD-10-CM | POA: Diagnosis not present

## 2023-11-08 ENCOUNTER — Encounter: Payer: Self-pay | Admitting: Obstetrics and Gynecology

## 2023-11-08 LAB — CYTOLOGY - PAP
Comment: NEGATIVE
Diagnosis: UNDETERMINED — AB
High risk HPV: NEGATIVE

## 2023-11-14 ENCOUNTER — Other Ambulatory Visit: Payer: Self-pay | Admitting: Obstetrics and Gynecology

## 2023-11-14 DIAGNOSIS — R9230 Dense breasts, unspecified: Secondary | ICD-10-CM

## 2023-11-14 DIAGNOSIS — N6489 Other specified disorders of breast: Secondary | ICD-10-CM

## 2023-11-14 DIAGNOSIS — N924 Excessive bleeding in the premenopausal period: Secondary | ICD-10-CM

## 2023-11-14 DIAGNOSIS — Z01419 Encounter for gynecological examination (general) (routine) without abnormal findings: Secondary | ICD-10-CM

## 2023-11-14 DIAGNOSIS — R635 Abnormal weight gain: Secondary | ICD-10-CM

## 2023-12-22 ENCOUNTER — Ambulatory Visit
Admission: RE | Admit: 2023-12-22 | Discharge: 2023-12-22 | Disposition: A | Discharge status: Home / Self Care | Payer: BC Managed Care – PPO | Source: Ambulatory Visit | Attending: Obstetrics and Gynecology | Admitting: Obstetrics and Gynecology

## 2023-12-22 DIAGNOSIS — N6489 Other specified disorders of breast: Secondary | ICD-10-CM

## 2023-12-28 ENCOUNTER — Encounter: Payer: Self-pay | Admitting: Obstetrics and Gynecology

## 2023-12-31 LAB — TSH RFX ON ABNORMAL TO FREE T4: TSH: 1.12 u[IU]/mL (ref 0.450–4.500)

## 2023-12-31 LAB — CORTISOL: Cortisol: 9.1 ug/dL (ref 6.2–19.4)

## 2024-01-03 ENCOUNTER — Encounter: Payer: Self-pay | Admitting: Obstetrics and Gynecology

## 2024-02-08 ENCOUNTER — Encounter: Payer: Self-pay | Admitting: Obstetrics and Gynecology

## 2024-02-08 DIAGNOSIS — N924 Excessive bleeding in the premenopausal period: Secondary | ICD-10-CM

## 2024-02-14 ENCOUNTER — Ambulatory Visit: Payer: BC Managed Care – PPO

## 2024-02-14 DIAGNOSIS — N924 Excessive bleeding in the premenopausal period: Secondary | ICD-10-CM | POA: Diagnosis not present

## 2024-02-23 NOTE — Progress Notes (Signed)
 GYNECOLOGY OFFICE VISIT NOTE  History:   Jordan Larson is a 43 y.o. [phone] here today for EMB and preop discussion prior to ablation. .   She has heavy bleeding and is perimenopausal.  She had a CBC through [facility] and her HgB was normal. She takes the OCP to skip her period every other month but will start to get BTB. She has tried other pills in the past but has had negative side effects on her mood from them as she is very sensitive. Partner with vasectomy.     Past Medical History:  Diagnosis Date   C. difficile enteritis 2011   Sensitive to antibiotics; does well on Amoxicillin   Cervical polyp    Labor, precipitous, delivered    Medical history non-contributory    Vasovagal syncope     Past Surgical History:  Procedure Laterality Date   COLONOSCOPY     WISDOM TOOTH EXTRACTION      The following portions of the patient's history were reviewed and updated as appropriate: allergies, current medications, past family history, past medical history, past social history, past surgical history and problem list.   Health Maintenance:     Diagnosis  Date Value Ref Range Status  11/02/2023 (A)  Final   - Atypical squamous cells of undetermined significance (ASC-US)   HPV negative  Review of Systems:  Pertinent items noted in HPI and remainder of comprehensive ROS otherwise negative.  Physical Exam:  BP 102/69   Pulse 73   Ht 5\' 8"  (1.727 m)   Wt 190 lb (86.2 kg)   BMI 28.89 kg/m  CONSTITUTIONAL: Well-developed, well-nourished female in no acute distress.  HEENT:  Normocephalic, atraumatic. External right and left ear normal. No scleral icterus.  NECK: Normal range of motion, supple, no masses noted on observation SKIN: No rash noted. Not diaphoretic. No erythema. No pallor. MUSCULOSKELETAL: Normal range of motion. No edema noted. NEUROLOGIC: Alert and oriented to person, place, and time. Normal muscle tone coordination. No cranial nerve deficit noted. PSYCHIATRIC:  Normal mood and affect. Normal behavior. Normal judgment and thought content.  PELVIC: Normal appearing external genitalia; normal urethral meatus; normal appearing vaginal mucosa and cervix.  No abnormal discharge noted.  Normal uterine size, no other palpable masses, no uterine or adnexal tenderness. Performed in the presence of a chaperone   GYNECOLOGY OFFICE PROCEDURE NOTE   ENDOMETRIAL BIOPSY     The indications for endometrial biopsy were reviewed.   Risks of the biopsy including cramping, bleeding, infection, uterine perforation, inadequate specimen and need for additional procedures were discussed. Offered alternative of hysteroscopy, dilation and curettage in OR. The patient states she understands the R/B/I/A and agrees to undergo procedure today. Urine pregnancy test was Negative. Consent was signed. Time out was performed.    Patient was positioned in dorsal lithotomy position. A vaginal speculum was placed.  The cervix was visualized and was prepped with Betadine.  A single-toothed tenaculum was placed on the anterior lip of the cervix to stabilize it. The 3 mm pipelle was easily introduced into the endometrial cavity without difficulty to a depth of 9 cm, and a Moderate amount of tissue was obtained after two passes and sent to pathology. The instruments were removed from the patient's vagina. Minimal bleeding from the cervix was noted. The patient tolerated the procedure well.  US done on 3/4 - not yet read as final - but normal appearing [place] without defects. EL 10 mm. Normal ovaries bilaterally. Slightly anteverted uterus.  Assessment and Plan:   1. Perimenopausal menorrhagia (Primary) Patient was given post procedure instructions.  Will follow up pathology and manage accordingly; patient will be contacted with results and recommendations.  Routine preventative health maintenance measures emphasized.  2. Preop examination - Diagnosis: HVB/perimenopausal - Planned surgery: D&C,  Hysteroscopy, endometrial ablation - Risks of surgery include but are not limited to: bleeding, infection, injury to surrounding organs/tissues (i.e. bowel/bladder/ureters), need for additional procedures, wound complications, hospital re-admission, and conversion to open surgery, VTE, additional complications: Uterine perforation - We discussed postop restrictions, precautions and expectations - Preop testing needed:  EMB done today - All questions answered   Routine preventative health maintenance measures emphasized. Please refer to After Visit Summary for other counseling recommendations.   Return if symptoms worsen or fail to improve.  Milas Hock, MD, FACOG Obstetrician & Gynecologist, [facility] for [facility], [facility]

## 2024-02-27 ENCOUNTER — Other Ambulatory Visit (HOSPITAL_COMMUNITY)
Admission: RE | Admit: 2024-02-27 | Discharge: 2024-02-27 | Disposition: A | Discharge status: Home / Self Care | Source: Ambulatory Visit | Attending: Obstetrics and Gynecology | Admitting: Obstetrics and Gynecology

## 2024-02-27 ENCOUNTER — Encounter: Payer: Self-pay | Admitting: Obstetrics and Gynecology

## 2024-02-27 ENCOUNTER — Ambulatory Visit: Payer: BC Managed Care – PPO | Admitting: Obstetrics and Gynecology

## 2024-02-27 VITALS — BP 102/69 | HR 73 | Ht 68.0 in | Wt 190.0 lb

## 2024-02-27 DIAGNOSIS — Z01812 Encounter for preprocedural laboratory examination: Secondary | ICD-10-CM | POA: Insufficient documentation

## 2024-02-27 DIAGNOSIS — N924 Excessive bleeding in the premenopausal period: Secondary | ICD-10-CM

## 2024-02-27 DIAGNOSIS — Z01818 Encounter for other preprocedural examination: Secondary | ICD-10-CM | POA: Diagnosis not present

## 2024-02-27 DIAGNOSIS — Z3202 Encounter for pregnancy test, result negative: Secondary | ICD-10-CM | POA: Diagnosis not present

## 2024-02-27 LAB — POCT URINE PREGNANCY: Preg Test, Ur: NEGATIVE

## 2024-02-28 LAB — SURGICAL PATHOLOGY

## 2024-03-01 ENCOUNTER — Encounter: Payer: Self-pay | Admitting: Obstetrics and Gynecology

## 2024-03-03 ENCOUNTER — Encounter: Payer: Self-pay | Admitting: Obstetrics and Gynecology

## 2024-03-09 ENCOUNTER — Telehealth: Payer: Self-pay

## 2024-03-09 NOTE — Telephone Encounter (Signed)
 Called patient to schedule surgery w/ Dr. Para March. Patient chose 05/01/24 @11  am and arrival time of 9 am. Patient was provided pre-op instructions and surgery details over the phone. Written confirmation will be sent to patients Mychart acct.

## 2024-04-23 ENCOUNTER — Encounter (HOSPITAL_COMMUNITY): Payer: Self-pay | Admitting: Obstetrics and Gynecology

## 2024-04-24 ENCOUNTER — Encounter (HOSPITAL_COMMUNITY): Payer: Self-pay | Admitting: Obstetrics and Gynecology

## 2024-04-24 NOTE — Progress Notes (Signed)
 Spoke w/ via phone for pre-op interview--- Jordan Larson Lab needs dos----  UPT per surgeon.       Lab results------ COVID test -----patient states asymptomatic no test needed Arrive at -------1045 NPO after MN NO Solid Food.  Clear liquids from MN until---0945 Pre-Surgery Ensure or G2:  Med rec completed Medications to take morning of surgery -----NONE Diabetic medication -----  GLP1 agonist last dose: GLP1 instructions:  Patient instructed no nail polish to be worn day of surgery Patient instructed to bring photo id and insurance card day of surgery Patient aware to have Driver (ride ) / caregiver    for 24 hours after surgery - Husband Jordan Larson Patient Special Instructions ----- Shower with antibacterial soap. Pre-Op special Instructions ----- Patient has hx of vasovagal syncope, has had syncope episodes with IV starts. States she should be ok if she is lying and can look away, also requests we don't talk about what's happening when starting the IV.  Patient verbalized understanding of instructions that were given at this phone interview. Patient denies chest pain, sob, fever, cough at the interview.

## 2024-04-30 NOTE — Progress Notes (Signed)
 Left voicemail for pt to inform of updated arrival time of 1015. Instructed to stop clear liquids at 0915. Requested pt to call back to confirm.

## 2024-05-01 ENCOUNTER — Ambulatory Visit (HOSPITAL_COMMUNITY): Payer: Self-pay | Admitting: Anesthesiology

## 2024-05-01 ENCOUNTER — Ambulatory Visit (HOSPITAL_COMMUNITY)
Admission: RE | Admit: 2024-05-01 | Discharge: 2024-05-01 | Disposition: A | Discharge status: Home / Self Care | Attending: Obstetrics and Gynecology | Admitting: Obstetrics and Gynecology

## 2024-05-01 ENCOUNTER — Other Ambulatory Visit: Payer: Self-pay

## 2024-05-01 ENCOUNTER — Encounter (HOSPITAL_COMMUNITY): Payer: Self-pay | Admitting: Obstetrics and Gynecology

## 2024-05-01 ENCOUNTER — Encounter (HOSPITAL_COMMUNITY)
Admission: RE | Disposition: A | Discharge status: Home / Self Care | Payer: Self-pay | Source: Home / Self Care | Attending: Obstetrics and Gynecology

## 2024-05-01 DIAGNOSIS — N920 Excessive and frequent menstruation with regular cycle: Secondary | ICD-10-CM | POA: Diagnosis present

## 2024-05-01 DIAGNOSIS — Z793 Long term (current) use of hormonal contraceptives: Secondary | ICD-10-CM | POA: Diagnosis not present

## 2024-05-01 DIAGNOSIS — N939 Abnormal uterine and vaginal bleeding, unspecified: Secondary | ICD-10-CM

## 2024-05-01 DIAGNOSIS — F32A Depression, unspecified: Secondary | ICD-10-CM | POA: Insufficient documentation

## 2024-05-01 DIAGNOSIS — F419 Anxiety disorder, unspecified: Secondary | ICD-10-CM | POA: Diagnosis not present

## 2024-05-01 DIAGNOSIS — N924 Excessive bleeding in the premenopausal period: Secondary | ICD-10-CM | POA: Diagnosis not present

## 2024-05-01 HISTORY — PX: HYSTEROSCOPY: SHX211

## 2024-05-01 HISTORY — DX: Anxiety disorder, unspecified: F41.9

## 2024-05-01 HISTORY — DX: Depression, unspecified: F32.A

## 2024-05-01 LAB — POCT PREGNANCY, URINE: Preg Test, Ur: NEGATIVE

## 2024-05-01 SURGERY — ABLATION, ENDOMETRIUM, HYSTEROSCOPIC
Anesthesia: General | Site: Uterus

## 2024-05-01 MED ORDER — LACTATED RINGERS IV SOLN: INTRAVENOUS | Status: DC

## 2024-05-01 MED ORDER — SILVER NITRATE-POT NITRATE 75-25 % EX MISC
CUTANEOUS | Status: DC | PRN
  Administered 2024-05-01: 2

## 2024-05-01 MED ORDER — LIDOCAINE 2% (20 MG/ML) 5 ML SYRINGE
INTRAMUSCULAR | Status: DC | PRN
  Administered 2024-05-01: 60 mg via INTRAVENOUS

## 2024-05-01 MED ORDER — OXYCODONE HCL 5 MG/5ML PO SOLN: 5.0000 mg | Freq: Once | ORAL | Status: DC | PRN

## 2024-05-01 MED ORDER — ACETAMINOPHEN 500 MG PO TABS
1000.0000 mg | ORAL_TABLET | ORAL | Status: AC
  Administered 2024-05-01: 1000 mg via ORAL

## 2024-05-01 MED ORDER — AMISULPRIDE (ANTIEMETIC) 5 MG/2ML IV SOLN: 10.0000 mg | Freq: Once | INTRAVENOUS | Status: DC | PRN

## 2024-05-01 MED ORDER — IBUPROFEN 600 MG PO TABS: 600.0000 mg | ORAL_TABLET | Freq: Four times a day (QID) | ORAL | 0 refills | Status: AC | PRN

## 2024-05-01 MED ORDER — FENTANYL CITRATE (PF) 250 MCG/5ML IJ SOLN
INTRAMUSCULAR | Status: DC | PRN
  Administered 2024-05-01: 25 ug via INTRAVENOUS
  Administered 2024-05-01 (×2): 50 ug via INTRAVENOUS
  Administered 2024-05-01: 25 ug via INTRAVENOUS

## 2024-05-01 MED ORDER — LIDOCAINE-EPINEPHRINE 1 %-1:100000 IJ SOLN
INTRAMUSCULAR | Status: DC | PRN
  Administered 2024-05-01: 20 mL

## 2024-05-01 MED ORDER — CHLORHEXIDINE GLUCONATE 0.12 % MT SOLN
OROMUCOSAL | Status: AC
  Filled 2024-05-01: qty 15

## 2024-05-01 MED ORDER — ACETAMINOPHEN 500 MG PO TABS
ORAL_TABLET | ORAL | Status: AC
  Filled 2024-05-01: qty 2

## 2024-05-01 MED ORDER — CHLORHEXIDINE GLUCONATE 0.12 % MT SOLN
15.0000 mL | Freq: Once | OROMUCOSAL | Status: AC
  Administered 2024-05-01: 15 mL via OROMUCOSAL

## 2024-05-01 MED ORDER — ONDANSETRON HCL 4 MG/2ML IJ SOLN
INTRAMUSCULAR | Status: DC | PRN
  Administered 2024-05-01: 4 mg via INTRAVENOUS

## 2024-05-01 MED ORDER — ORAL CARE MOUTH RINSE: 15.0000 mL | Freq: Once | OROMUCOSAL | Status: AC

## 2024-05-01 MED ORDER — FENTANYL CITRATE (PF) 100 MCG/2ML IJ SOLN: 25.0000 ug | INTRAMUSCULAR | Status: DC | PRN

## 2024-05-01 MED ORDER — FENTANYL CITRATE (PF) 250 MCG/5ML IJ SOLN
INTRAMUSCULAR | Status: AC
  Filled 2024-05-01: qty 5

## 2024-05-01 MED ORDER — OXYCODONE HCL 5 MG PO TABS: 5.0000 mg | ORAL_TABLET | Freq: Once | ORAL | Status: DC | PRN

## 2024-05-01 MED ORDER — OXYCODONE HCL 5 MG PO CAPS: 5.0000 mg | ORAL_CAPSULE | ORAL | 0 refills | Status: AC | PRN

## 2024-05-01 MED ORDER — SODIUM CHLORIDE 0.9 % IR SOLN
Status: DC | PRN
  Administered 2024-05-01: 3000 mL

## 2024-05-01 MED ORDER — LIDOCAINE-EPINEPHRINE 1 %-1:100000 IJ SOLN
INTRAMUSCULAR | Status: AC
  Filled 2024-05-01: qty 2

## 2024-05-01 MED ORDER — DEXAMETHASONE SODIUM PHOSPHATE 10 MG/ML IJ SOLN
INTRAMUSCULAR | Status: DC | PRN
  Administered 2024-05-01: 10 mg via INTRAVENOUS

## 2024-05-01 MED ORDER — PROPOFOL 10 MG/ML IV BOLUS
INTRAVENOUS | Status: DC | PRN
  Administered 2024-05-01: 200 mg via INTRAVENOUS

## 2024-05-01 MED ORDER — POVIDONE-IODINE 10 % EX SWAB: 2.0000 | Freq: Once | CUTANEOUS | Status: DC

## 2024-05-01 MED ORDER — EPHEDRINE SULFATE-NACL 50-0.9 MG/10ML-% IV SOSY
PREFILLED_SYRINGE | INTRAVENOUS | Status: DC | PRN
  Administered 2024-05-01 (×2): 7.5 mg via INTRAVENOUS

## 2024-05-01 MED ORDER — MIDAZOLAM HCL 2 MG/2ML IJ SOLN
INTRAMUSCULAR | Status: AC
  Filled 2024-05-01: qty 2

## 2024-05-01 MED ORDER — MIDAZOLAM HCL 2 MG/2ML IJ SOLN
INTRAMUSCULAR | Status: DC | PRN
  Administered 2024-05-01: 2 mg via INTRAVENOUS

## 2024-05-01 SURGICAL SUPPLY — 14 items
ABLATOR SURESOUND NOVASURE (ABLATOR) ×1 IMPLANT
CATH ROBINSON RED A/P 16FR (CATHETERS) IMPLANT
COVER MAYO STAND STRL (DRAPES) ×1 IMPLANT
DILATOR CANAL MILEX (MISCELLANEOUS) IMPLANT
GLOVE BIO SURGEON STRL SZ 6 (GLOVE) ×1 IMPLANT
GLOVE SURG UNDER POLY LF SZ7 (GLOVE) ×1 IMPLANT
GOWN STRL REUS W/ TWL LRG LVL3 (GOWN DISPOSABLE) ×1 IMPLANT
KIT PROCEDURE FLUENT (KITS) ×1 IMPLANT
PACK VAGINAL MINOR WOMEN LF (CUSTOM PROCEDURE TRAY) ×1 IMPLANT
PAD OB MATERNITY 11 LF (PERSONAL CARE ITEMS) ×1 IMPLANT
SEAL ROD LENS SCOPE MYOSURE (ABLATOR) ×1 IMPLANT
SET GENESYS HTA PROCERVA (MISCELLANEOUS) IMPLANT
SOL .9 NS 3000ML IRR UROMATIC (IV SOLUTION) IMPLANT
TOWEL GREEN STERILE FF (TOWEL DISPOSABLE) ×1 IMPLANT

## 2024-05-01 NOTE — Anesthesia Preprocedure Evaluation (Addendum)
 Anesthesia Evaluation  Patient identified by MRN, date of birth, ID band Patient awake    Reviewed: Allergy & Precautions, NPO status , Patient's Chart, lab work & pertinent test results  Airway Mallampati: III  TM Distance: >3 FB Neck ROM: Full  Mouth opening: Limited Mouth Opening  Dental no notable dental hx. (+) Teeth Intact, Dental Advisory Given   Pulmonary neg pulmonary ROS   Pulmonary exam normal breath sounds clear to auscultation       Cardiovascular negative cardio ROS Normal cardiovascular exam Rhythm:Regular Rate:Normal     Neuro/Psych  PSYCHIATRIC DISORDERS Anxiety Depression    negative neurological ROS     GI/Hepatic negative GI ROS, Neg liver ROS,,,  Endo/Other  negative endocrine ROS    Renal/GU negative Renal ROS  negative genitourinary   Musculoskeletal negative musculoskeletal ROS (+)    Abdominal   Peds  Hematology negative hematology ROS (+)   Anesthesia Other Findings   Reproductive/Obstetrics                             Anesthesia Physical Anesthesia Plan  ASA: 2  Anesthesia Plan: General   Post-op Pain Management: Tylenol  PO (pre-op)*   Induction: Intravenous  PONV Risk Score and Plan: 3 and Ondansetron , Dexamethasone and Midazolam  Airway Management Planned: LMA  Additional Equipment:   Intra-op Plan:   Post-operative Plan: Extubation in OR  Informed Consent: I have reviewed the patients History and Physical, chart, labs and discussed the procedure including the risks, benefits and alternatives for the proposed anesthesia with the patient or authorized representative who has indicated his/her understanding and acceptance.     Dental advisory given  Plan Discussed with: CRNA  Anesthesia Plan Comments:        Anesthesia Quick Evaluation

## 2024-05-01 NOTE — Anesthesia Postprocedure Evaluation (Signed)
 Anesthesia Post Note  Patient: Jordan Larson  Procedure(s) Performed: ABLATION, ENDOMETRIUM, HYSTEROSCOPIC/ CERVICAL DILATION (Uterus)     Patient location during evaluation: PACU Anesthesia Type: General Level of consciousness: awake and alert Pain management: pain level controlled Vital Signs Assessment: post-procedure vital signs reviewed and stable Respiratory status: spontaneous breathing, nonlabored ventilation, respiratory function stable and patient connected to nasal cannula oxygen Cardiovascular status: blood pressure returned to baseline and stable Postop Assessment: no apparent nausea or vomiting Anesthetic complications: no  No notable events documented.  Last Vitals:  Vitals:   05/01/24 1330 05/01/24 1345  BP: 117/76 120/70  Pulse: 88 83  Resp: 16   Temp: 36.6 C   SpO2: 97% 96%    Last Pain:  Vitals:   05/01/24 1300  TempSrc:   PainSc: 0-No pain                 Jordan Larson

## 2024-05-01 NOTE — H&P (Addendum)
 [facility] Obstetrics and Gynecology Attending History and Physical  Jordan Larson is a 43 y.o. [phone] who presented to [facility] today for D&C, hysteroscopy and endometrial ablation.   She has a history of heavy bleeding. She has been on OCPs for her periods but doesn't like the side effects. Her spouse had a vasectomy for contraception. She had a CBC through [facility] and her HgB was normal. She takes the OCP to skip her period every other month but will start to get BTB. She has tried other pills in the past but has had negative side effects on her mood from them as she is very sensitive.   She had an EMB on 3/17 which was negative for hyperplasia/malignancy.  She had a pap on 10/2023: ASCUS cytology, HPV negative  She had an US  on 3/21 which showed EL 11mm. Otherwise normal pelvic ultrasound.    Past Medical History:  Diagnosis Date   Anxiety    C. difficile enteritis 2011   Sensitive to antibiotics; does well on Amoxicillin    Cervical polyp    Depression    Labor, precipitous, delivered    Medical history non-contributory    Vasovagal syncope    Vasovagal syncope    Past Surgical History:  Procedure Laterality Date   COLONOSCOPY     WISDOM TOOTH EXTRACTION     OB History  Gravida Para Term Preterm AB Living  4 4 4   4   SAB IAB Ectopic Multiple Live Births     0 4    # Outcome Date GA Lbr Len/2nd Weight Sex Type Anes PTL Lv  4 Term 07/21/19 [redacted]w[redacted]d 03:29 / 00:12 3654 g M Vag-Spont EPI  LIV  3 Term 10/18/16 [redacted]w[redacted]d 01:15 / 00:33 3550 g F Vag-Spont EPI  LIV     Complications: Precipitate labor  2 Term 02/12/13 [redacted]w[redacted]d  3147 g F Vag-Spont EPI  LIV     Complications: Precipitate labor  1 Term 04/03/10 [redacted]w[redacted]d  3714 g M Vag-Vacuum EPI  LIV     Complications: Precipitate labor  Patient denies any other pertinent gynecologic issues.  No current facility-administered medications on file prior to encounter.   Current Outpatient Medications on File Prior to Encounter  Medication Sig  Dispense Refill   bifidobacterium infantis (ALIGN) capsule Take 1 capsule by mouth at bedtime.     cetirizine (ZYRTEC) 10 MG tablet Take 10 mg by mouth at bedtime.      norethindrone-ethinyl estradiol-FE (LOESTRIN FE) 1-20 MG-MCG tablet Take 1 tablet by mouth daily.     Sertraline HCl (ZOLOFT PO) Take by mouth.     Norethindrone Acetate-Ethinyl Estrad-FE (LOESTRIN 24 FE) 1-20 MG-MCG(24) tablet Take 1 tablet by mouth daily. 28 tablet 12   No Known Allergies  Social History:   reports that she has never smoked. She has never used smokeless tobacco. She reports that she does not drink alcohol and does not use drugs. Family History  Problem Relation Age of Onset   Diabetes Father    Hypertension Paternal Grandmother    COPD Paternal Grandmother    Skin cancer Mother    Breast cancer Maternal Grandmother 34   Vision loss Maternal Grandmother        glaucoma   Cancer Maternal Grandfather        colon   Breast cancer Maternal Aunt     Review of Systems: Pertinent items noted in HPI and remainder of comprehensive ROS otherwise negative.  PHYSICAL EXAM: Height 5\' 8"  (1.727 m),  weight 84.4 kg, last menstrual period 04/02/2024. CONSTITUTIONAL: Well-developed, well-nourished female in no acute distress.  HENT:  Normocephalic, atraumatic, External right and left ear normal. Oropharynx is clear and moist EYES: Conjunctivae and EOM are normal. Pupils are equal, round, and reactive to light. No scleral icterus.  NECK: Normal range of motion, supple, no masses SKIN: Skin is warm and dry. No rash noted. Not diaphoretic. No erythema. No pallor. NEUROLOGIC: Alert and oriented to person, place, and time. Normal reflexes, muscle tone coordination. No cranial nerve deficit noted. PSYCHIATRIC: Normal mood and affect. Normal behavior. Normal judgment and thought content. CARDIOVASCULAR: Normal heart rate noted, regular rhythm RESPIRATORY: Effort and breath sounds normal, no problems with respiration  noted ABDOMEN: Soft, nontender, nondistended. PELVIC: Not examined MUSCULOSKELETAL: Normal range of motion. No tenderness.  No cyanosis, clubbing, or edema.  2+ distal pulses.  Labs: 06/28/23: CBC at [facility] was 14.2   Assessment: Active Problems:   Menorrhagia, premenopausal   Plan: - Diagnosis: HVB/perimenopausal - Planned surgery: D&C, Hysteroscopy, endometrial ablation - Risks of surgery include but are not limited to: bleeding, infection, injury to surrounding organs/tissues (i.e. bowel/bladder/ureters), need for additional procedures, wound complications, hospital re-admission, and conversion to open surgery, VTE, additional complications: Uterine perforation. Reviewed if uterine perforation I cannot complete the procedure and we will have to try again at a later time - I.e. 4-6 weeks later.  - We discussed postop restrictions, precautions and expectations - Preop testing needed: EMB wnl, US  wnl - All questions answered. Consent signed.   Lacey Pian, MD, FACOG Obstetrician & Gynecologist, [facility] for [facility], [facility]

## 2024-05-01 NOTE — Op Note (Signed)
 Jordan Larson PROCEDURE DATE: 05/01/2024   PREOPERATIVE DIAGNOSIS:  Menorrhagia  POSTOPERATIVE DIAGNOSIS:  Same  PROCEDURE:  Dilation, hysteroscopy, Novasure endometrial ablation  SURGEON:  Dr. Lacey Pian  ASSISTANT:  None  ANESTHESIA:  LMA, Paracervical block with 1% lidocaine  (20 cc)  COMPLICATIONS:  None immediate.  ESTIMATED BLOOD LOSS:  10 ml.  FLUIDS: 600 ml LR.  URINE OUTPUT:  None - not drained  INDICATIONS: 43 y.o. [id number] with heavy menstrual bleeding.    FINDINGS:  NEFG, normal appearing cervix. Normal proliferative endometrial cavity without defects. [place] visualized bilaterally. Homogenous char noted after procedure.   TECHNIQUE:  The patient was taken to the operating room where LMA anesthesia was obtained without difficulty.  She was then placed in the dorsal lithotomy position and prepared and draped in sterile fashion.  After an adequate timeout was performed, a bivalved speculum was then placed in the patient's vagina, and the anterior lip of cervix grasped with the single-tooth tenaculum. A paracervical block was performed injected 10 cc into the 4 and 8 oclock position of the cervix in each location.  The cervix was dilated with [name] dilators. Hysteroscope inserted and cavity had aforementioned findings. Uterine sound was 8 cm. Cervical length was 4 cm giving cavity length of 4. Novasure device inserted and cavity width found to be 3.8. Cavity assessment done and assured. Device activated. Power was 84 and length of ablation was 1:18. Device removed once completed and hysteroscope inserted and homogenous char noted. Tenaculum removed and some bleeding at the sites. Silver nitrate applied.  All instruments removed and procedure was ended.   The patient will be discharged to home as per PACU criteria.  Routine postoperative instructions given.  Lacey Pian, MD Attending Obstetrician & Gynecologist, [facility] for [facility], [facility]

## 2024-05-01 NOTE — Anesthesia Procedure Notes (Signed)
 Procedure Name: LMA Insertion Date/Time: 05/01/2024 12:24 PM  Performed by: Merna Aase, CRNAPre-anesthesia Checklist: Patient identified, Patient being monitored, Timeout performed, Emergency Drugs available and Suction available Patient Re-evaluated:Patient Re-evaluated prior to induction Oxygen Delivery Method: Circle system utilized Preoxygenation: Pre-oxygenation with 100% oxygen Induction Type: IV induction Ventilation: Mask ventilation without difficulty LMA: LMA inserted LMA Size: 3.0 Tube type: Oral Number of attempts: 1 Placement Confirmation: positive ETCO2 and breath sounds checked- equal and bilateral Tube secured with: Tape Dental Injury: Teeth and Oropharynx as per pre-operative assessment

## 2024-05-01 NOTE — Transfer of Care (Signed)
 Immediate Anesthesia Transfer of Care Note  Patient: Jordan Larson  Procedure(s) Performed: ABLATION, ENDOMETRIUM, HYSTEROSCOPIC/ CERVICAL DILATION (Uterus)  Patient Location: PACU  Anesthesia Type:General  Level of Consciousness: awake  Airway & Oxygen Therapy: Patient Spontanous Breathing  Post-op Assessment: Report given to RN and Post -op Vital signs reviewed and stable  Post vital signs: Reviewed and stable  Last Vitals:  Vitals Value Taken Time  BP 132/71 05/01/24 1301  Temp 36.7 C 05/01/24 1300  Pulse 103 05/01/24 1303  Resp 15 05/01/24 1303  SpO2 98 % 05/01/24 1303  Vitals shown include unfiled device data.  Last Pain:  Vitals:   05/01/24 1103  TempSrc: Oral  PainSc: 0-No pain      Patients Stated Pain Goal: 6 (05/01/24 1103)  Complications: No notable events documented.

## 2024-05-01 NOTE — Discharge Instructions (Addendum)
  Post Anesthesia Home Care Instructions  Activity: Get plenty of rest for the remainder of the day. A responsible individual must stay with you for 24 hours following the procedure.  For the next 24 hours, DO NOT: -Drive a car -Advertising copywriter -Drink alcoholic beverages -Take any medication unless instructed by your physician -Make any legal decisions or sign important papers.  Meals: Start with liquid foods such as gelatin or soup. Progress to regular foods as tolerated. Avoid greasy, spicy, heavy foods. If nausea and/or vomiting occur, drink only clear liquids until the nausea and/or vomiting subsides. Call your physician if vomiting continues.  Special Instructions/Symptoms: Your throat may feel dry or sore from the anesthesia or the breathing tube placed in your throat during surgery. If this causes discomfort, gargle with warm salt water. The discomfort should disappear within 24 hours.  If you had a scopolamine patch placed behind your ear for the management of post- operative nausea and/or vomiting:    D & C Home care Instructions:   Personal hygiene:  Used sanitary napkins for vaginal drainage not tampons. Shower or tub bathe the day after your procedure. No douching until bleeding stops. Always wipe from front to back after  Elimination.  Activity: Do not drive or operate any equipment today. The effects of the anesthesia are still present and drowsiness may result. Rest today, not necessarily flat bed rest, just take it easy. You may resume your normal activity in one to 2 days.  Sexual activity: No intercourse for one week or as indicated by your physician  Diet: Eat a light diet as desired this evening. You may resume a regular diet tomorrow.  Return to work: One to 2 days.  General Expectations of your surgery: Vaginal bleeding should be no heavier than a normal period. Spotting may continue up to 10 days. Mild cramps may continue for a couple of days. You may have a  regular period in 2-6 weeks.  Unexpected observations call your doctor if these occur: persistent or heavy bleeding. Severe abdominal cramping or pain. Elevation of temperature greater than 100F.  May take Tylenol  beginning at 5 PM as needed for soreness/cramping.

## 2024-05-02 ENCOUNTER — Encounter (HOSPITAL_COMMUNITY): Payer: Self-pay | Admitting: Obstetrics and Gynecology

## 2024-05-31 ENCOUNTER — Encounter: Payer: Self-pay | Admitting: Obstetrics and Gynecology

## 2024-06-07 ENCOUNTER — Encounter: Payer: Self-pay | Admitting: Obstetrics and Gynecology

## 2024-06-07 ENCOUNTER — Ambulatory Visit (INDEPENDENT_AMBULATORY_CARE_PROVIDER_SITE_OTHER): Admitting: Obstetrics and Gynecology

## 2024-06-07 VITALS — BP 108/75 | HR 62 | Ht 68.0 in | Wt 183.0 lb

## 2024-06-07 DIAGNOSIS — Z09 Encounter for follow-up examination after completed treatment for conditions other than malignant neoplasm: Secondary | ICD-10-CM

## 2024-06-07 NOTE — Progress Notes (Signed)
   GYNECOLOGY OFFICE VISIT NOTE  History:  Jordan Larson is a 43 y.o. [phone] here today for postop check s/p ablation. She had spotting and vaginal discharge for about one month but then it stopped and she has been doing well since that time.   She had an ablation on 5/20.   She has no complaints and is doing well.   Past Medical History:  Diagnosis Date   Anxiety    C. difficile enteritis 2011   Sensitive to antibiotics; does well on Amoxicillin    Cervical polyp    Depression    Labor, precipitous, delivered    Medical history non-contributory    Vasovagal syncope    Vasovagal syncope     Past Surgical History:  Procedure Laterality Date   COLONOSCOPY     HYSTEROSCOPY N/A 05/01/2024   Procedure: ABLATION, ENDOMETRIUM, HYSTEROSCOPIC/ CERVICAL DILATION;  Surgeon: Cleatus Moccasin, MD;  Location: [facility] OR;  Service: Gynecology;  Laterality: N/A;  Hysteroscopy/HTA/Novasure   WISDOM TOOTH EXTRACTION      The following portions of the patient's history were reviewed and updated as appropriate: allergies, current medications, past family history, past medical history, past social history, past surgical history and problem list.   Health Maintenance:   ASCUS/HPV negative 10/2023 Diagnosis  Date Value Ref Range Status  11/02/2023 (A)  Final   - Atypical squamous cells of undetermined significance (ASC-US )   Review of Systems:  Pertinent items noted in HPI and remainder of comprehensive ROS otherwise negative.  Physical Exam:  BP 108/75   Pulse 62   Ht 5' 8 (1.727 m)   Wt 183 lb (83 kg)   LMP 05/31/2024   BMI 27.83 kg/m  CONSTITUTIONAL: Well-developed, well-nourished female in no acute distress.  HEENT:  Normocephalic, atraumatic. External right and left ear normal. No scleral icterus.  NECK: Normal range of motion, supple, no masses noted on observation SKIN: No rash noted. Not diaphoretic. No erythema. No pallor. MUSCULOSKELETAL: Normal range of motion. No edema  noted. NEUROLOGIC: Alert and oriented to person, place, and time. Normal muscle tone coordination. No cranial nerve deficit noted. PSYCHIATRIC: Normal mood and affect. Normal behavior. Normal judgment and thought content.  PELVIC: Deferred  Labs and Imaging No results found for this or any previous visit (from the past week). No results found.  Assessment and Plan:  1. Postop check (Primary) Reviewed surgery and intra-op photos.  Discussed warning signs for PATS Spouse had vasectomy so no need for additional contraception.     No orders of the defined types were placed in this encounter.    Routine preventative health maintenance measures emphasized. Please refer to After Visit Summary for other counseling recommendations.   No follow-ups on file.  Moccasin Cleatus, MD, FACOG Obstetrician & Gynecologist, [facility] for [facility], [facility]
